# Patient Record
Sex: Female | Born: 1950 | Race: White | Hispanic: No | Marital: Married | Smoking: Never smoker
Health system: Southern US, Community
[De-identification: ages and names within clinical notes are randomized; demographics above are authoritative.]

## PROBLEM LIST (undated history)

## (undated) DIAGNOSIS — R112 Nausea with vomiting, unspecified: Secondary | ICD-10-CM

## (undated) DIAGNOSIS — E611 Iron deficiency: Secondary | ICD-10-CM

## (undated) DIAGNOSIS — N390 Urinary tract infection, site not specified: Secondary | ICD-10-CM

## (undated) DIAGNOSIS — K635 Polyp of colon: Secondary | ICD-10-CM

## (undated) DIAGNOSIS — R32 Unspecified urinary incontinence: Secondary | ICD-10-CM

## (undated) DIAGNOSIS — K589 Irritable bowel syndrome without diarrhea: Secondary | ICD-10-CM

## (undated) DIAGNOSIS — D649 Anemia, unspecified: Secondary | ICD-10-CM

## (undated) DIAGNOSIS — Z9889 Other specified postprocedural states: Secondary | ICD-10-CM

## (undated) DIAGNOSIS — M1990 Unspecified osteoarthritis, unspecified site: Secondary | ICD-10-CM

## (undated) DIAGNOSIS — R413 Other amnesia: Secondary | ICD-10-CM

## (undated) HISTORY — DX: Polyp of colon: K63.5

## (undated) HISTORY — DX: Unspecified urinary incontinence: R32

## (undated) HISTORY — DX: Other amnesia: R41.3

## (undated) HISTORY — PX: KNEE ARTHROSCOPY: SUR90

## (undated) HISTORY — PX: LAPAROSCOPIC CHOLECYSTECTOMY: SUR755

## (undated) HISTORY — PX: VAGINAL HYSTERECTOMY: SUR661

## (undated) HISTORY — PX: REPLACEMENT TOTAL KNEE: SUR1224

## (undated) HISTORY — DX: Iron deficiency: E61.1

## (undated) HISTORY — PX: BACK SURGERY: SHX140

## (undated) HISTORY — PX: BLADDER SURGERY: SHX569

## (undated) HISTORY — DX: Irritable bowel syndrome, unspecified: K58.9

---

## 1992-10-13 HISTORY — PX: TOE SURGERY: SHX1073

## 2000-09-11 ENCOUNTER — Encounter (INDEPENDENT_AMBULATORY_CARE_PROVIDER_SITE_OTHER): Payer: Self-pay | Admitting: *Deleted

## 2000-09-11 ENCOUNTER — Ambulatory Visit (HOSPITAL_BASED_OUTPATIENT_CLINIC_OR_DEPARTMENT_OTHER): Admission: RE | Admit: 2000-09-11 | Discharge: 2000-09-11 | Payer: Self-pay | Admitting: General Surgery

## 2003-12-01 ENCOUNTER — Ambulatory Visit (HOSPITAL_COMMUNITY): Admission: RE | Admit: 2003-12-01 | Discharge: 2003-12-01 | Payer: Self-pay | Admitting: Internal Medicine

## 2004-01-22 ENCOUNTER — Ambulatory Visit (HOSPITAL_BASED_OUTPATIENT_CLINIC_OR_DEPARTMENT_OTHER): Admission: RE | Admit: 2004-01-22 | Discharge: 2004-01-22 | Payer: Self-pay | Admitting: Urology

## 2004-12-29 ENCOUNTER — Emergency Department (HOSPITAL_COMMUNITY): Admission: EM | Admit: 2004-12-29 | Discharge: 2004-12-29 | Payer: Self-pay | Admitting: Emergency Medicine

## 2005-04-21 ENCOUNTER — Ambulatory Visit (HOSPITAL_COMMUNITY): Admission: RE | Admit: 2005-04-21 | Discharge: 2005-04-22 | Payer: Self-pay | Admitting: Neurosurgery

## 2005-12-04 ENCOUNTER — Ambulatory Visit: Payer: Self-pay | Admitting: Internal Medicine

## 2006-11-02 ENCOUNTER — Ambulatory Visit: Payer: Self-pay | Admitting: Internal Medicine

## 2006-12-25 ENCOUNTER — Ambulatory Visit: Payer: Self-pay | Admitting: Internal Medicine

## 2006-12-31 ENCOUNTER — Ambulatory Visit (HOSPITAL_COMMUNITY): Admission: RE | Admit: 2006-12-31 | Discharge: 2006-12-31 | Payer: Self-pay | Admitting: Internal Medicine

## 2010-09-30 ENCOUNTER — Ambulatory Visit: Payer: Self-pay | Admitting: Gynecology

## 2010-10-21 ENCOUNTER — Inpatient Hospital Stay (HOSPITAL_COMMUNITY)
Admission: RE | Admit: 2010-10-21 | Discharge: 2010-10-24 | Payer: Self-pay | Source: Home / Self Care | Attending: Orthopedic Surgery | Admitting: Orthopedic Surgery

## 2010-10-28 LAB — CBC
HCT: 32.5 % — ABNORMAL LOW (ref 36.0–46.0)
HCT: 30 % — ABNORMAL LOW (ref 36.0–46.0)
HCT: 29.4 % — ABNORMAL LOW (ref 36.0–46.0)
Hemoglobin: 10.5 g/dL — ABNORMAL LOW (ref 12.0–15.0)
Hemoglobin: 9.5 g/dL — ABNORMAL LOW (ref 12.0–15.0)
MCH: 30.1 pg (ref 26.0–34.0)
MCH: 30.1 pg (ref 26.0–34.0)
MCH: 30.2 pg (ref 26.0–34.0)
MCHC: 32.3 g/dL (ref 30.0–36.0)
MCHC: 32.3 g/dL (ref 30.0–36.0)
MCHC: 32.3 g/dL (ref 30.0–36.0)
MCV: 93.1 fL (ref 78.0–100.0)
MCV: 93.2 fL (ref 78.0–100.0)
MCV: 93.3 fL (ref 78.0–100.0)
Platelets: 245 10*3/uL (ref 150–400)
Platelets: 203 10*3/uL (ref 150–400)
Platelets: 207 10*3/uL (ref 150–400)
RBC: 3.49 MIL/uL — ABNORMAL LOW (ref 3.87–5.11)
RBC: 3.22 MIL/uL — ABNORMAL LOW (ref 3.87–5.11)
RBC: 3.15 MIL/uL — ABNORMAL LOW (ref 3.87–5.11)
RDW: 13.6 % (ref 11.5–15.5)
RDW: 13.5 % (ref 11.5–15.5)
RDW: 13.6 % (ref 11.5–15.5)
WBC: 8.6 10*3/uL (ref 4.0–10.5)
WBC: 8.5 10*3/uL (ref 4.0–10.5)
WBC: 8 10*3/uL (ref 4.0–10.5)

## 2010-10-28 LAB — BASIC METABOLIC PANEL
BUN: 10 mg/dL (ref 6–23)
BUN: 7 mg/dL (ref 6–23)
CO2: 28 mEq/L (ref 19–32)
CO2: 27 mEq/L (ref 19–32)
Calcium: 8.9 mg/dL (ref 8.4–10.5)
Calcium: 8.6 mg/dL (ref 8.4–10.5)
Chloride: 102 mEq/L (ref 96–112)
Chloride: 105 mEq/L (ref 96–112)
Creatinine, Ser: 0.71 mg/dL (ref 0.4–1.2)
Creatinine, Ser: 0.66 mg/dL (ref 0.4–1.2)
GFR calc Af Amer: >60 mL/min (ref >60)
GFR calc Af Amer: >60 mL/min (ref >60)
GFR calc non Af Amer: >60 mL/min (ref >60)
GFR calc non Af Amer: >60 mL/min (ref >60)
Glucose, Bld: 120 mg/dL — ABNORMAL HIGH (ref 70–99)
Glucose, Bld: 137 mg/dL — ABNORMAL HIGH (ref 70–99)
Potassium: 4.3 mEq/L (ref 3.5–5.1)
Potassium: 3.7 mEq/L (ref 3.5–5.1)
Sodium: 137 mEq/L (ref 135–145)
Sodium: 138 mEq/L (ref 135–145)

## 2010-10-28 LAB — PROTIME-INR
INR: 1.09 (ref 0.00–1.49)
INR: 1.61 — ABNORMAL HIGH (ref 0.00–1.49)
Prothrombin Time: 14.3 seconds (ref 11.6–15.2)
Prothrombin Time: 16.9 seconds — ABNORMAL HIGH (ref 11.6–15.2)
Prothrombin Time: 19.3 seconds — ABNORMAL HIGH (ref 11.6–15.2)

## 2010-10-28 LAB — TYPE AND SCREEN
ABO/RH(D): A POS
Antibody Screen: NEGATIVE

## 2010-10-28 LAB — ABO/RH: ABO/RH(D): A POS

## 2010-11-03 ENCOUNTER — Encounter: Payer: Self-pay | Admitting: *Deleted

## 2010-11-13 NOTE — Discharge Summary (Signed)
Tara Bates, Tara Bates               ACCOUNT NO.:  [id number]  MEDICAL RECORD NO.:  [id number]          PATIENT TYPE:  INP  LOCATION:  1608                         FACILITY:  [facility]  PHYSICIAN:  Ollen Gross, M.D.    DATE OF BIRTH:  1951-05-15  DATE OF ADMISSION:  10/21/2010 DATE OF DISCHARGE:  10/24/2010                              DISCHARGE SUMMARY   ADMITTING DIAGNOSES: 1. Osteoarthritis, left knee. 2. Irritable bowel syndrome. 3. Lactose intolerance. 4. Mild urinary incontinence which is intermittent. 5. History of urinary tract infections. 6. Degenerative disk disease.  DISCHARGE DIAGNOSES: 1. Osteoarthritis, left knee, status post left total knee replacement     arthroplasty. 2. Postop acute blood loss anemia. 3. Irritable bowel syndrome. 4. Lactose intolerance. 5. Mild urinary incontinence which is intermittent. 6. History of urinary tract infections. 7. Degenerative disk disease.  PROCEDURE:  October 21, 2010, left total knee.  Surgeon, Dr. Lequita Halt. Assistant, Alexzandrew L. Perkins, P.A.C.  Anesthesia, general. Tourniquet time, 35 minutes.  CONSULTS:  None.  BRIEF HISTORY:  Tara Bates is a 60 year old female with end-stage arthritis of left knee, progressive worsening pain and dysfunction, failed nonoperative management, now presents for a total knee arthroplasty.  LABORATORY DATA:  Preop CBC showed hemoglobin of 12.6, hematocrit of 39.4 white cell count 6.1, platelets 273.  PT/INR of 13.4 and 1 with PTT of 29.  Chem panel on admission all within normal limits.  Preop UA negative.  Blood group type A positive.  Nasal swabs were negative staph aureus and MRSA.  Serial CBCs were followed.  Hemoglobin dropped down to 10.5, then 9.7; last H and H was 9.5 and 29.4.  Serial protime followed per Coumadin protocol.  Last PT/INR of 19.3 and 1.61.  Serial BMETs were followed for 48 hours.  Electrolytes remained within normal limits.  HOSPITAL COURSE:  The patient  admitted to [facility], taken to OR, underwent above-stated procedure without complications.  The patient tolerated the procedure well, later transferred to recovery room on orthopedic floor.  She was started on p.o. and IV analgesic pain control following surgery, given 24 hours postop IV antibiotics.  She had some intermittent nausea throughout the morning which was improved with meds. Did not get much sleep but started to get it with therapy.  Hemoglobin was 10.5 on day #1.  Hemovac drain was pulled.  By day #2, her nausea had improved.  Her hemoglobin was down a little bit to 9.7 which was asymptomatic with this.  We changed the dressing.  Her incision looked good.  She was walking over 80 feet.  She continued to progress well and by postop day #3, the nausea had all but gone.  She just had a little bit intermittent.  She felt good otherwise and she wanted to go home after therapy.  DISCHARGE/PLAN: 1. The patient home on October 24, 2010. 2. Discharge diagnoses, please see above. 3. Discharge meds:  Coumadin, Nu-Iron, Robaxin, Percocet, Phenergan.  DIET:  As tolerated.  FOLLOWUP:  Two weeks.  ACTIVITY:  Weightbearing as tolerated.  Total knee protocol; Home Health PT, Home Health Nursing.  DISPOSITION:  Home.  CONDITION ON DISCHARGE:  Improved.     Alexzandrew L. Julien Girt, P.A.C.   ______________________________ Ollen Gross, M.D.    ALP/MEDQ  D:  10/24/2010  T:  10/24/2010  Job:  [id number]  cc:   Fara Chute Fax: [phone]  Electronically Signed by Patrica Duel P.A.C. on 10/31/2010 11:09:43 AM Electronically Signed by Ollen Gross M.D. on 11/13/2010 03:33:53 PM

## 2010-12-23 LAB — CBC
HCT: 39.4 % (ref 36.0–46.0)
Hemoglobin: 12.6 g/dL (ref 12.0–15.0)
MCH: 30.1 pg (ref 26.0–34.0)
MCHC: 32 g/dL (ref 30.0–36.0)
MCV: 94.3 fL (ref 78.0–100.0)
Platelets: 273 10*3/uL (ref 150–400)
RBC: 4.18 MIL/uL (ref 3.87–5.11)
RDW: 13.9 % (ref 11.5–15.5)
WBC: 6.1 10*3/uL (ref 4.0–10.5)

## 2010-12-23 LAB — URINALYSIS, ROUTINE W REFLEX MICROSCOPIC
Bilirubin Urine: NEGATIVE
Glucose, UA: NEGATIVE mg/dL
Hgb urine dipstick: NEGATIVE
Ketones, ur: NEGATIVE mg/dL
Nitrite: NEGATIVE
Protein, ur: NEGATIVE mg/dL
Specific Gravity, Urine: 1.006 (ref 1.005–1.030)
Urobilinogen, UA: 0.2 mg/dL (ref 0.0–1.0)
pH: 6.5 (ref 5.0–8.0)

## 2010-12-23 LAB — COMPREHENSIVE METABOLIC PANEL
ALT: 22 U/L (ref 0–35)
AST: 23 U/L (ref 0–37)
Albumin: 4.1 g/dL (ref 3.5–5.2)
Alkaline Phosphatase: 45 U/L (ref 39–117)
BUN: 11 mg/dL (ref 6–23)
CO2: 31 mEq/L (ref 19–32)
Calcium: 9.5 mg/dL (ref 8.4–10.5)
Chloride: 106 mEq/L (ref 96–112)
Creatinine, Ser: 0.81 mg/dL (ref 0.4–1.2)
GFR calc Af Amer: >60 mL/min (ref >60)
GFR calc non Af Amer: >60 mL/min (ref >60)
Glucose, Bld: 86 mg/dL (ref 70–99)
Potassium: 4.1 mEq/L (ref 3.5–5.1)
Sodium: 144 mEq/L (ref 135–145)
Total Bilirubin: 0.8 mg/dL (ref 0.3–1.2)
Total Protein: 7.3 g/dL (ref 6.0–8.3)

## 2010-12-23 LAB — PROTIME-INR
INR: 1 (ref 0.00–1.49)
Prothrombin Time: 13.4 seconds (ref 11.6–15.2)

## 2010-12-23 LAB — APTT: aPTT: 29 seconds (ref 24–37)

## 2010-12-23 LAB — SURGICAL PCR SCREEN
MRSA, PCR: NEGATIVE
Staphylococcus aureus: NEGATIVE

## 2010-12-26 ENCOUNTER — Other Ambulatory Visit (HOSPITAL_COMMUNITY)
Admission: RE | Admit: 2010-12-26 | Discharge: 2010-12-26 | Disposition: A | Discharge status: Home / Self Care | Payer: Federal, State, Local not specified - PPO | Source: Ambulatory Visit | Attending: Gynecology | Admitting: Gynecology

## 2010-12-26 ENCOUNTER — Ambulatory Visit (INDEPENDENT_AMBULATORY_CARE_PROVIDER_SITE_OTHER): Payer: Federal, State, Local not specified - PPO | Admitting: Gynecology

## 2010-12-26 ENCOUNTER — Other Ambulatory Visit: Payer: Self-pay | Admitting: Gynecology

## 2010-12-26 DIAGNOSIS — Z01419 Encounter for gynecological examination (general) (routine) without abnormal findings: Secondary | ICD-10-CM

## 2010-12-26 DIAGNOSIS — Z1211 Encounter for screening for malignant neoplasm of colon: Secondary | ICD-10-CM

## 2010-12-26 DIAGNOSIS — Z833 Family history of diabetes mellitus: Secondary | ICD-10-CM

## 2010-12-26 DIAGNOSIS — N951 Menopausal and female climacteric states: Secondary | ICD-10-CM

## 2010-12-26 DIAGNOSIS — Z13220 Encounter for screening for lipoid disorders: Secondary | ICD-10-CM

## 2010-12-26 DIAGNOSIS — Z124 Encounter for screening for malignant neoplasm of cervix: Secondary | ICD-10-CM | POA: Insufficient documentation

## 2010-12-31 ENCOUNTER — Encounter (INDEPENDENT_AMBULATORY_CARE_PROVIDER_SITE_OTHER): Payer: Federal, State, Local not specified - PPO

## 2010-12-31 DIAGNOSIS — Z13820 Encounter for screening for osteoporosis: Secondary | ICD-10-CM

## 2011-01-02 ENCOUNTER — Institutional Professional Consult (permissible substitution) (INDEPENDENT_AMBULATORY_CARE_PROVIDER_SITE_OTHER): Payer: Federal, State, Local not specified - PPO | Admitting: Gynecology

## 2011-01-02 DIAGNOSIS — N951 Menopausal and female climacteric states: Secondary | ICD-10-CM

## 2011-01-02 DIAGNOSIS — E559 Vitamin D deficiency, unspecified: Secondary | ICD-10-CM

## 2011-02-28 NOTE — Op Note (Signed)
NAMEMIRIAH, MARUYAMA               ACCOUNT NO.:  [id number]   MEDICAL RECORD NO.:  [id number]          PATIENT TYPE:  OIB   LOCATION:  3040                         FACILITY:  [facility]   PHYSICIAN:  Cristi Loron, M.D.DATE OF BIRTH:  04-Jun-1951   DATE OF PROCEDURE:  04/21/2005  DATE OF DISCHARGE:                                 OPERATIVE REPORT   BRIEF HISTORY:  The patient is a 60 year old white female who suffered from  back and right leg pain.  She failed medical management and was worked up  with a lumbar MRI which demonstrated a herniated disc at L3-4 and L4-5 on  the right.  I discussed the various treatment options with her including  surgery.  The patient has weighed the risks, benefits, and alternatives of  surgery and has decided to proceed with a right L3-4 and L4-5  microdiskectomy.   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   PROCEDURE:  Right L3-4 and L4-5 microdiskectomy using microdissection.   SURGEON:  Cristi Loron, M.D.   ASSISTANT:  Stefani Dama, M.D.   ANESTHESIA:  General endotracheal.   ESTIMATED BLOOD LOSS:  50 cc.   SPECIMENS:  None.   DRAINS:  None.   COMPLICATIONS:  None.   DESCRIPTION OF PROCEDURE:  The patient was brought to the operating room by  the anesthesia team.  General endotracheal anesthesia was induced.  The  patient was then carefully turned to the prone position on the Wilson frame.  Her lumbosacral region was then prepared with Betadine scrub and Betadine  solution.  Sterile drapes were applied.  I then injected the area to be  incised with Marcaine with epinephrine solution.  I then used a scalpel to  make a linear midline incision over the L3-4 and L4-5 interspace.  I used  electrocautery to perform a  subperiosteal dissection, exposing the right  spinous process and lamina of L3, L4, and L5.  I then obtained  intraoperative radiograph to confirm our location.  I then brought the  operative microscope into the field, and under its magnification and  illumination I completed the microdissection/decompression.  I used a high-  speed drill to perform a right L3 and L4 laminotomy.  I widened the  laminotomy with the Kerrison punch and then performed a foraminotomy about  the right L4 and L5 nerve roots; particularly the L5 nerve root neural  foramen was quite stenotic.  We then used microdissection to free up the  thecal sac and the nerve roots from the epidural tissue.  Dr. Danielle Dess  carefully retracted the right thecal sac and L5 nerve root medially which  exposed an underlying free fragment disc herniation which had migrated in a  caudal direction and was severely compressing the L5 nerve root as it exited  the neural foramen.  We freed it up with a nerve hook and then removed it in  multiple fragments using the pituitary  forceps which greatly decompressed  the right L5 nerve root.  We then inspected the L4-5 intervertebral disc and  noted that it was bulging somewhat but was not causing any significant  neural compression of the thecal sac or the L5 nerve root.  We therefore did  not enter into the intervertebral disc space.   We then repeated this procedure at L3-4.  We widened the L3 laminotomy.  We  performed a foraminotomy about the L4 nerve root.  Dr. Danielle Dess then carefully  retracted the thecal sac and the right L4 nerve root medially with the  D'erico retractor.  This exposed the underlying intervertebral disc.  There  was a moderate-size disc herniation which had migrated caudally.  We freed  it up using microdissection.  We then removed the disc herniation in  multiple fragments with the pituitary forceps, decompressing the right L4  nerve root.  We also inspected the right L3-4  intervertebral disc.  There  was some bulging again, but no significant neural compression.  We therefore  did not enter into the intervertebral disc space.  We at this point had a  good decompression of the right L4 and L5 nerve root.  We then obtained  stringent hemostasis using bipolar electrocautery.  We copiously irrigated  the wound out with bacitracin solution.  We removed the solution, then  removed the [facility] retractor.  We then reapproximated the patient's  thoracolumbar fascia with interrupted #1 Vicryl suture, subcutaneous tissue  with interrupted 2-0 Vicryl suture, and the skin with Steri-Strips and  Benzoin.  The wound was then coated with bacitracin ointment.  A sterile  dressing was applied.  The drapes were removed.  The patient was  subsequently returned to the supine position, where she was extubated by the  anesthesia team and transported to the postanesthesia care unit in stable  condition.  All sponge, instrument, and needle counts were correct at the  end of this case.      Cristi Loron, M.D.  Electronically Signed     JDJ/MEDQ  D:  04/21/2005  T:  04/22/2005  Job:  [id number]

## 2011-02-28 NOTE — Op Note (Signed)
NAME:  Tara Bates, Tara Bates                         ACCOUNT NO.:  [id number]   MEDICAL RECORD NO.:  [id number]                   PATIENT TYPE:  AMB   LOCATION:  NESC                                 FACILITY:  [facility]   PHYSICIAN:  Sigmund I. Patsi Sears, M.D.         DATE OF BIRTH:  06/20/1951   DATE OF PROCEDURE:  01/22/2004  DATE OF DISCHARGE:                                 OPERATIVE REPORT   PREOPERATIVE DIAGNOSES:  1. Recurrent pelvic prolapse.  2. Urinary incontinence.   POSTOPERATIVE DIAGNOSES:  1. Recurrent pelvic prolapse.  2. Urinary incontinence.   OPERATION:  Mentor transvaginal pubovaginal sling and anterior vaginal vault  repair with Vicryl mesh.   SURGEON:  Dr. Patsi Sears.   ANESTHESIA:  General LMA.   PREPARATION:  After appropriate preanesthesia, the patient is brought to the  operating room, placed on the operating table in dorsal supine position  where general LMA anesthesia was introduced.  She was then re-placed in the  dorsal lithotomy position where pubis was prepped with Betadine solution and  draped in the usual fashion.   REVIEW OF HISTORY:  This 60 year old female is status post vaginal  hysterectomy with anterior vaginal vault repair and pubovaginal sling in  March 2001.  Currently, the patient complains of severe cough, laugh, sneeze  incontinence, as well as urge incontinence.  She was dry after her original  surgery but began to leak 1 year postoperatively.  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  She has had physical examination which shows  anterior vaginal vault weakness, cystocele, positive Marshall test, positive  Q-tip test.  Urodynamics is accomplished, and the results are recorded in  her office chart.  She is now for pubovaginal sling and anterior vaginal  vault repair.   DESCRIPTION OF PROCEDURE:  With the patient in the dorsal lithotomy  position, after  prepping the pubis, Foley catheter was placed.  A 2 cm  anterior urethral incision is made, subcutaneous tissue dissected after 10  mL of Marcaine 0.25 plain were injected into the anterior paracervical  periurethral tissue.  Following dissection, tissue dissection was accomplished bilaterally, which  was very difficult because of scarring.  Dissection was accomplished  bilaterally, to the level of the retropubic fascia.  Five centimeters  lateral to the clitoris, an incision is made and using the Mentor  transobturator sling approach, a pubovaginal sling was placed.  With the  midline of the sling protected, cystoscopy was accomplished with both the 70-  degree lens and the 12-degree lens.  There was no evidence of any injury to  the bladder; therefore; a cystoscopy was finished and the bladder drained of  fluid again.  The wound was then closed in two layers with 3-0 Vicryl  suture.   The anterior vaginal vault repair was then accomplished by injecting the  anterior vagina  with Marcaine 0.25% plain and making a 10 cm incision, and  subcutaneous tissue was dissected bilaterally with sharp and blunt  dissection.  The anterior vaginal vault was accomplished using horizontal  mattress Kelly plication sutures, reinforced with a piece of Vicryl mesh  which was sutured into place in six places, at the 1 o'clock position, the 3  o'clock position, the 5 o'clock position, and the 11 o'clock, the 9 o'clock,  and the 7 o'clock positions.  All bleeding was electrocoagulated, and the  scarred vaginal tissue was excised.  Fresh edges were brought together in  two layers with 3-0 Vicryl suture.  Foley catheter was taken out, and no  bleeding was noted, and no packing was used.  The patient was then awakened  and taken to the recovery room in good condition.                                               Sigmund I. Patsi Sears, M.D.    SIT/MEDQ  D:  01/22/2004  T:  01/22/2004  Job:  [id number]

## 2011-02-28 NOTE — Op Note (Signed)
NAME:  Tara Bates, Tara Bates                         ACCOUNT NO.:  [id number]   MEDICAL RECORD NO.:  [id number]                   PATIENT TYPE:  AMB   LOCATION:  DAY                                  FACILITY:  [facility]   PHYSICIAN:  Lionel December, M.D.                 DATE OF BIRTH:  28-Aug-1951   DATE OF PROCEDURE:  12/01/2003  DATE OF DISCHARGE:                                 OPERATIVE REPORT   PROCEDURE:  Total colonoscopy.   INDICATIONS FOR PROCEDURE:  [name] is a 60 year old Caucasian female with a  history of colonic polyps.  Her last exam was in September of 1999 when she  had a tubular adenoma removed. She is undergoing surveillance examination.  The procedure is reviewed with the patient and informed consent was  obtained.   PREOP MEDICATIONS:  Demerol 50 mg IV, Versed 6 mg IV in divided dose.   FINDINGS:  Procedure performed in endoscopy suite.  The patient's vital  signs and O2 saturations were monitored during the procedure and remained  stable. The patient was placed in the left lateral decubitus position,  rectal examination performed.  No abnormality noted on external or digital  exam. The Olympus videoscope was placed in the rectum and advanced under  direct vision into the sigmoid colon where a few scattered diverticula were  noted. Preparation was felt to be excellent. The scope was passed in the  cecum which was identified by appendiceal orifice and ileocecal valve.  Pictures taken for the record. As the scope was withdrawn, the colonic  mucosa was carefully examined. There was a single small polyp at the rectum  which was ablated via cold biopsy.  Anal rectal junction was examined by  retroflexing the scope and was normal.  The endoscope was straightened and  withdrawn. The patient tolerated the procedure well.   FINAL DIAGNOSIS:  1. A few scattered diverticula at the sigmoid colon.  2. Small polyp of the rectum that was ablated via cold biopsy.   RECOMMENDATIONS:  1.  She will continue her high fiber diet and Citrucel as before.  2. Dicyclomine 10-20 mg p.o. t.i.d. p.r.n. for abdominal cramps or relapse     of her irritable bowel syndrome.  A prescription given for 60 with two     refills.  3. I will be contacting the patient with biopsy results.      ___________________________________________                                            Lionel December, M.D.   NR/MEDQ  D:  12/01/2003  T:  12/01/2003  Job:  [id number]   cc:   Fara Chute  [address] [place]  [place] [address]  Fax: [phone]

## 2011-09-11 ENCOUNTER — Encounter: Payer: Self-pay | Admitting: Gynecology

## 2011-09-11 ENCOUNTER — Other Ambulatory Visit: Payer: Self-pay | Admitting: *Deleted

## 2011-09-11 DIAGNOSIS — R922 Inconclusive mammogram: Secondary | ICD-10-CM

## 2011-09-18 ENCOUNTER — Other Ambulatory Visit: Payer: Self-pay | Admitting: Gynecology

## 2011-09-18 DIAGNOSIS — R922 Inconclusive mammogram: Secondary | ICD-10-CM

## 2011-11-20 ENCOUNTER — Encounter: Payer: Self-pay | Admitting: Gynecology

## 2011-11-20 ENCOUNTER — Ambulatory Visit (INDEPENDENT_AMBULATORY_CARE_PROVIDER_SITE_OTHER): Payer: Federal, State, Local not specified - PPO | Admitting: Gynecology

## 2011-11-20 VITALS — BP 110/70

## 2011-11-20 DIAGNOSIS — Z1211 Encounter for screening for malignant neoplasm of colon: Secondary | ICD-10-CM

## 2011-11-20 DIAGNOSIS — R102 Pelvic and perineal pain: Secondary | ICD-10-CM

## 2011-11-20 DIAGNOSIS — N949 Unspecified condition associated with female genital organs and menstrual cycle: Secondary | ICD-10-CM

## 2011-11-20 LAB — URINALYSIS W MICROSCOPIC + REFLEX CULTURE
Bilirubin Urine: NEGATIVE
Glucose, UA: NEGATIVE mg/dL
Hgb urine dipstick: NEGATIVE
Ketones, ur: NEGATIVE mg/dL
Leukocytes, UA: NEGATIVE
Nitrite: NEGATIVE
Protein, ur: NEGATIVE mg/dL
Specific Gravity, Urine: <1.005 — ABNORMAL LOW (ref 1.005–1.030)
Urobilinogen, UA: 0.2 mg/dL (ref 0.0–1.0)
pH: 6.5 (ref 5.0–8.0)

## 2011-11-20 NOTE — Patient Instructions (Addendum)
Will see you next week for ultrasound.  Cystocele Repair A cystocele is a bulging, drooping hernia or break (rupture) of bladder tissue into the birth canal (vagina). This bulging or rupture occurs on the top front wall of the vagina. CAUSES  Cystocele is associated with weakness of the top front wall of the vagina due to stretching and tearing of the ligaments and muscles in the area. This is often the result of:  Multiple childbirths.   Continuous heavy lifting.   Chronic cough from asthma, emphysema, or smoking.   Being overweight.   Changes from aging.   Previous surgery in the vaginal area.   Menopause with loss of estrogen hormone and weakening of the ligaments and muscles around the bladder.  SYMPTOMS   Uncontrolled loss of urine (incontinence) with cough, sneeze, or exercise.   Pelvic pressure.   Frequency or urgency to urinate because of inability to completely empty the bladder.   Bladder infections.   Needing to push on the upper vagina to help yourself pass urine.  DIAGNOSIS  A cystocele can be diagnosed by doing a pelvic exam and observing the top of the vagina drooping or bulging into or out of the vagina. TREATMENT  Surgical options:  Cystocele repair is surgery that removes the hernia.   There are also different "sling" operations that may be used.  Discuss the different types of surgeries to repair a cystocele with your caregiver. Your caregiver will decide what type of surgery will be best in your case. Nonsurgical options:  Kegel exercises. This helps strengthen and tighten the muscles and tissue in and around the bladder and vagina. This may help with mild cases of cystocele.   A pessary may help the cystocele. A pessary is a plastic or rubber device that lifts the bladder into place. A pessary must be fitted by a doctor.   Tampons or diaphragms that lift the bladder into place are sometimes helpful with a minor or small cystocele.   Estrogen may  help with mild cases in menopausal and aging women.  LET YOUR CAREGIVER KNOW ABOUT:   Allergies to food or medicine.   Medicines taken, including vitamins, herbs, eyedrops, over-the-counter medicines, and creams.   Use of steroids (by mouth or creams).   Previous problems with anesthetics or numbing medicines.   History of bleeding problems or blood clots.   Previous surgery.   Other health problems, including diabetes and kidney problems.   Possibility of pregnancy, if this applies.  RISKS AND COMPLICATIONS  All surgery is associated with risks.  There are risks with a general anesthesia. You should discuss this with your caregiver.   With spinal or epidural anesthesia, there may be an area that is not numbed, and you could feel pain.   Headache could occur with a spinal or epidural anesthetic.   The catheter you will have after surgery may not work properly or may get blocked and need to be replaced.   Excessive bleeding.   Infection.   Injury to surrounding structures.   Recurrence of the cystocele.   Surgery may not get rid of your symptoms.  BEFORE THE PROCEDURE   Do not take aspirin or blood thinners for 1 week prior to surgery, unless instructed otherwise.   Do not eat or drink anything after midnight the night before surgery.   Let your caregiver know if you develop a cold or other infectious problems prior to surgery.   If being admitted the day of surgery, you  should be present 1 hour prior to your procedure or as directed by your caregiver.   Plan and arrange for help when you go home from the hospital.   If you smoke, do not smoke for at least 2 weeks before the surgery.   Do not drink any alcohol for 3 days before the surgery.  PROCEDURE  You will be given an anesthetic to prevent you from feeling pain during surgery. This may be a general anesthetic that puts you to sleep, or a spinal or epidural anesthetic. You will be asleep or be numbed through  the entire procedure. During cystocele repair, tissue is pulled from the sides and around the top of the vagina to lift up the hernia. This removes the hernia so that the top of the vagina does not fall into the opening of the vagina. AFTER THE PROCEDURE  After surgery, you will be taken to the recovery room where a nurse will take care of you, checking your breathing, blood pressure, pulse, and your progress. When your caregiver feels you are stable, you will be taken to your room. You will have a drainage tube (Foley catheter) that will drain your bladder for 2 to 7 days or longer, until your bladder is working properly. This catheter is placed prior to surgery to help keep your bladder empty and out of the way during the procedure. After surgery, this will make passing your urine easier. The catheter will be removed when you can easily pass urine without this assistance. You may have gauze packing in the vagina that will be removed 1 to 2 days after the surgery. Usually, you will be given a medicine (antibiotic) that kills germs. You will be given pain medicine as needed. You can usually go home in 3 to 5 days. HOME CARE INSTRUCTIONS   Do not take baths. Take showers until your caregiver informs you otherwise.   Take antibiotics as directed by your caregiver.   Exercise as instructed. Do not perform exercises which increase the pressure inside your belly (abdomen), such as sit-ups or lifting weights, until your caregiver has given permission. Walking exercise is preferred.   Only take over-the-counter or prescription medicines for pain and discomfort as directed by your caregiver.   Do not drink alcohol while taking pain medicine.   Do not lift anything over 5 pounds.   Do not drive until your caregiver gives you permission.   Get plenty of rest and sleep.   Have someone help with your household chores for 1 to 2 weeks.   If you develop constipation, you may take a mild laxative with your  caregiver's permission. Eating bran foods and drinking enough water and fluids to keep your urine clear or pale yellow helps with constipation.   Do not take aspirin. It may cause bleeding.   You may resume normal diet and unstrenuous activities as directed.   Do not douche, use tampons, or engage in intercourse until your surgeon has given permission.   Change bandages (dressings) as directed.   Make and keep all your postoperative appointments.  SEEK MEDICAL CARE IF:   You have abnormal vaginal discharge.   You develop a rash.   You are having a reaction to your medicine.   You develop nausea or vomiting.  SEEK IMMEDIATE MEDICAL CARE IF:   You have redness, swelling, or increasing pain in the vaginal area.   You notice pus coming from the vagina.   You have a fever.   You  notice a bad smell coming from the vagina.   You have increasing abdominal pain.   You have frequent urination or you notice burning during urination.   You notice blood in your urine.   You have excessive vaginal bleeding.   You cannot urinate.  MAKE SURE YOU:   Understand these instructions.   Will watch your condition.   Will get help right away if you are not doing well or get worse.  Document Released: 09/26/2000 Document Revised: 06/11/2011 Document Reviewed: 12/27/2009 [facility] Patient Information 2012 [place], [place].

## 2011-11-20 NOTE — Progress Notes (Signed)
Patient is a 61 year old who presented to the office today complaining of fullness in her lower abdomen feeling bloated tiredness and recently had right upper quadrant right shoulder discomfort. Patient denies any hematochezia. No change in bowel or urinary function. Patient with prior history of TVH for symptomatic fibroids and subsequently has had some form of stress incontinence surgery in [place], [place]. [place]. She subsequently continued to suffer from stress incontinence after that surgery and was seen by Dr. Patsi Sears who did a second incontinence operation several years ago. She is currently on Vivelle-Dot 0.05 which she applies twice a week. Patient has minimal stress urinary incontinence.  Exam: Abdomen: Soft nontender no rebound or guarding Pelvic/Bartholin urethra Skene glands within normal limits Vagina: Second-degree cystocele vaginal cuff intact no rectocele Adnexa: No palpable masses slight tenderness midportion of pelvis. Rectal: Not examined  Assessment/plan: We'll check a CBC, comprehensive metabolic panel today. Patient will return back to the office next week for an ultrasound to assess her adnexa. We'll need to speak with Dr. Patsi Sears to see what and tied incontinence operation he had done and whether he would be interested repairing her cystocele. Rectal: Not examined

## 2011-11-21 DIAGNOSIS — K635 Polyp of colon: Secondary | ICD-10-CM | POA: Insufficient documentation

## 2011-11-21 LAB — CBC WITH DIFFERENTIAL/PLATELET
Basophils Absolute: 0 10*3/uL (ref 0.0–0.1)
Basophils Relative: 0 % (ref 0–1)
Eosinophils Absolute: 0.1 10*3/uL (ref 0.0–0.7)
Eosinophils Relative: 3 % (ref 0–5)
HCT: 40.7 % (ref 36.0–46.0)
Hemoglobin: 12.9 g/dL (ref 12.0–15.0)
Lymphocytes Relative: 31 % (ref 12–46)
Lymphs Abs: 1.3 10*3/uL (ref 0.7–4.0)
MCH: 29.1 pg (ref 26.0–34.0)
MCHC: 31.7 g/dL (ref 30.0–36.0)
MCV: 91.7 fL (ref 78.0–100.0)
Monocytes Absolute: 0.4 10*3/uL (ref 0.1–1.0)
Monocytes Relative: 9 % (ref 3–12)
Neutro Abs: 2.4 10*3/uL (ref 1.7–7.7)
Neutrophils Relative %: 57 % (ref 43–77)
Platelets: 280 10*3/uL (ref 150–400)
RBC: 4.44 MIL/uL (ref 3.87–5.11)
RDW: 14.8 % (ref 11.5–15.5)
WBC: 4.2 10*3/uL (ref 4.0–10.5)

## 2011-11-21 LAB — COMPREHENSIVE METABOLIC PANEL
ALT: 17 U/L (ref 0–35)
AST: 22 U/L (ref 0–37)
Albumin: 4.5 g/dL (ref 3.5–5.2)
Alkaline Phosphatase: 48 U/L (ref 39–117)
BUN: 13 mg/dL (ref 6–23)
CO2: 32 mEq/L (ref 19–32)
Calcium: 9.9 mg/dL (ref 8.4–10.5)
Chloride: 102 mEq/L (ref 96–112)
Creat: 0.89 mg/dL (ref 0.50–1.10)
Glucose, Bld: 99 mg/dL (ref 70–99)
Potassium: 4.2 mEq/L (ref 3.5–5.3)
Sodium: 140 mEq/L (ref 135–145)
Total Bilirubin: 0.8 mg/dL (ref 0.3–1.2)
Total Protein: 7 g/dL (ref 6.0–8.3)

## 2011-11-21 NOTE — Progress Notes (Signed)
Addended by: Bertram Savin A on: 11/21/2011 10:56 AM   Modules accepted: Orders

## 2011-11-24 ENCOUNTER — Ambulatory Visit (INDEPENDENT_AMBULATORY_CARE_PROVIDER_SITE_OTHER): Payer: Federal, State, Local not specified - PPO | Admitting: Gynecology

## 2011-11-24 ENCOUNTER — Ambulatory Visit (INDEPENDENT_AMBULATORY_CARE_PROVIDER_SITE_OTHER): Payer: Federal, State, Local not specified - PPO

## 2011-11-24 ENCOUNTER — Encounter: Payer: Self-pay | Admitting: Gynecology

## 2011-11-24 VITALS — BP 120/72

## 2011-11-24 DIAGNOSIS — N8111 Cystocele, midline: Secondary | ICD-10-CM

## 2011-11-24 DIAGNOSIS — N83339 Acquired atrophy of ovary and fallopian tube, unspecified side: Secondary | ICD-10-CM

## 2011-11-24 DIAGNOSIS — IMO0002 Reserved for concepts with insufficient information to code with codable children: Secondary | ICD-10-CM | POA: Insufficient documentation

## 2011-11-24 DIAGNOSIS — N949 Unspecified condition associated with female genital organs and menstrual cycle: Secondary | ICD-10-CM

## 2011-11-24 DIAGNOSIS — R102 Pelvic and perineal pain: Secondary | ICD-10-CM

## 2011-11-24 NOTE — Progress Notes (Signed)
Patient is a 61 year old who presented to the office today to discuss her results of the ultrasound and recent lab work done in the office. Please see previous encounter note from February 7 whereby patient was complaining of lower abdominal pressure and bloating sensation and right upper quadrant discomfort.  Conference metabolic panel urinalysis and CBC were all normal.  Ultrasound: Absent uterus normal right and left ovary  Patient with symptomatic second degree cystocele contributing to her pressure but no stress incontinence reported. Patient with 2 prior anti-incontinent procedures 1 done in in [place], [place]. [place] and the other 1 done by Dr. Patsi Sears hearing [facility]. Patient is recommended she followup with Dr. Patsi Sears for cystocele repair/revision if patient's symptoms worsen. Patient was offered pessary but states she wants to wait and see after she sees Dr. Patsi Sears. Patient denies any vaginal atrophy or  dryness or irritation.

## 2011-11-24 NOTE — Patient Instructions (Signed)
Please make appointment with Dr. Patsi Sears urologist

## 2011-11-25 LAB — POC HEMOCCULT BLD/STL (OFFICE/1-CARD/DIAGNOSTIC): Fecal Occult Blood, POC: NEGATIVE

## 2011-11-28 ENCOUNTER — Ambulatory Visit: Payer: Federal, State, Local not specified - PPO | Admitting: Gynecology

## 2011-11-28 ENCOUNTER — Other Ambulatory Visit: Payer: Federal, State, Local not specified - PPO

## 2011-12-26 ENCOUNTER — Other Ambulatory Visit: Payer: Self-pay

## 2011-12-26 MED ORDER — ESTRADIOL 0.05 MG/24HR TD PTTW: 1.0000 | MEDICATED_PATCH | TRANSDERMAL | Status: DC

## 2011-12-26 NOTE — Telephone Encounter (Signed)
Patient has CE scheduled for the end of this month.

## 2012-01-02 ENCOUNTER — Encounter: Payer: Federal, State, Local not specified - PPO | Admitting: Gynecology

## 2012-01-08 ENCOUNTER — Ambulatory Visit (INDEPENDENT_AMBULATORY_CARE_PROVIDER_SITE_OTHER): Payer: Federal, State, Local not specified - PPO | Admitting: Gynecology

## 2012-01-08 ENCOUNTER — Encounter: Payer: Self-pay | Admitting: Gynecology

## 2012-01-08 VITALS — BP 110/78 | Ht 66.25 in | Wt 172.0 lb

## 2012-01-08 DIAGNOSIS — Z780 Asymptomatic menopausal state: Secondary | ICD-10-CM

## 2012-01-08 DIAGNOSIS — N951 Menopausal and female climacteric states: Secondary | ICD-10-CM

## 2012-01-08 DIAGNOSIS — Z01419 Encounter for gynecological examination (general) (routine) without abnormal findings: Secondary | ICD-10-CM

## 2012-01-08 MED ORDER — ESTRADIOL 0.0375 MG/24HR TD PTTW: 1.0000 | MEDICATED_PATCH | TRANSDERMAL | Status: DC

## 2012-01-08 NOTE — Progress Notes (Signed)
Tara Bates 1951/09/13 [id number]   History:    61 y.o.  gravida 2 para 2 who presented to the office for her annual gynecological examination. Patient is being followed by Dr. Neita Carp in [place], [place] for which she scheduled to get her lab work in the next few weeks. Review of her record indicated her mammogram was normal in 2012. Patient with prior history of a transvaginal hysterectomy and sling procedure. She has been on hormone replacement therapy now for 6 years. Patient with benign colonic polyps in 2009. Last bone density study March 2012 which was normal.  Past medical history,surgical history, family history and social history were all reviewed and documented in the EPIC chart.  Gynecologic History No LMP recorded. Patient has had a hysterectomy. Contraception: none Last Pap:  2012. Results were: normal Last mammogram:  2012. Results were: normal  Obstetric History OB History    Grav Para Term Preterm Abortions TAB SAB Ect Mult Living   2 2 2       2      # Outc Date GA Lbr Len/2nd Wgt Sex Del Anes PTL Lv   1 TRM     F SVD  No Yes   2 TRM     M SVD  No Yes       ROS:  Was performed and pertinent positives and negatives are included in the history.  Exam: chaperone present  BP 110/78  Ht 5' 6.25" (1.683 m)  Wt 172 lb (78.019 kg)  BMI 27.55 kg/m2  Body mass index is 27.55 kg/(m^2).  General appearance : Well developed well nourished female. No acute distress HEENT: Neck supple, trachea midline, no carotid bruits, no thyroidmegaly Lungs: Clear to auscultation, no rhonchi or wheezes, or rib retractions  Heart: Regular rate and rhythm, no murmurs or gallops Breast:Examined in sitting and supine position were symmetrical in appearance, no palpable masses or tenderness,  no skin retraction, no nipple inversion, no nipple discharge, no skin discoloration, no axillary or supraclavicular lymphadenopathy Abdomen: no palpable masses or tenderness, no rebound or  guarding Extremities: no edema or skin discoloration or tenderness  Pelvic:  Bartholin, Urethra, Skene Glands: Within normal limits             Vagina: No gross lesions or discharge second-degree cystocele   Cervix:  absent  Uterus  absent   Adnexa  Without masses or tenderness  Anus and perineum  normal   Rectovaginal  normal sphincter tone without palpated masses or tenderness             Hemoccult  cards presented to the patient to submit to the office for testing.   Assessment/Plan:  61 y.o. female for annual exam  was asymptomatic second-degree cystocele. We discussed about the women's health initiative study begin to taper off her hormone replacement therapy. She had been on Vivelle-Dot 0.05 which she applied twice a week. Were going to begin tapering her down with the next dose 0.0375 transdermally twice a week. The following year we will place her on Vagifem twice a week intravaginally. Patient instructed continue her calcium and vitamin D for osteoporosis prevention and to continue active lifestyle. Fecal occult blood testing cards were provided for her to submit to the office for testing. She will followup with Dr. Neita Carp for her lab work. Next year she will need a colonoscopy in bone density study. We discussed the new screening guidelines for Pap smear. She has never had an abnormal Pap smear in the  past and with her recent hysterectomy she no longer needs Pap smears.    Ok Edwards MD, 11:39 AM 01/08/2012

## 2012-01-08 NOTE — Patient Instructions (Signed)
Prescription in pharmacy. Remember to follow up for fasting labs with Dr. Neita Carp. Next year you need colonoscopy and bone density.

## 2012-02-03 DIAGNOSIS — Z1211 Encounter for screening for malignant neoplasm of colon: Secondary | ICD-10-CM

## 2012-02-04 ENCOUNTER — Other Ambulatory Visit: Payer: Self-pay | Admitting: *Deleted

## 2012-02-04 ENCOUNTER — Other Ambulatory Visit: Payer: Self-pay | Admitting: Gynecology

## 2012-02-04 DIAGNOSIS — Z1211 Encounter for screening for malignant neoplasm of colon: Secondary | ICD-10-CM

## 2012-07-01 ENCOUNTER — Telehealth: Payer: Self-pay | Admitting: *Deleted

## 2012-07-01 ENCOUNTER — Ambulatory Visit (INDEPENDENT_AMBULATORY_CARE_PROVIDER_SITE_OTHER): Payer: Federal, State, Local not specified - PPO | Admitting: Gynecology

## 2012-07-01 ENCOUNTER — Other Ambulatory Visit: Payer: Self-pay | Admitting: Gynecology

## 2012-07-01 ENCOUNTER — Encounter: Payer: Self-pay | Admitting: Gynecology

## 2012-07-01 VITALS — BP 140/82

## 2012-07-01 DIAGNOSIS — R1013 Epigastric pain: Secondary | ICD-10-CM

## 2012-07-01 DIAGNOSIS — R140 Abdominal distension (gaseous): Secondary | ICD-10-CM

## 2012-07-01 DIAGNOSIS — R194 Change in bowel habit: Secondary | ICD-10-CM | POA: Insufficient documentation

## 2012-07-01 DIAGNOSIS — R198 Other specified symptoms and signs involving the digestive system and abdomen: Secondary | ICD-10-CM

## 2012-07-01 DIAGNOSIS — Z79890 Hormone replacement therapy (postmenopausal): Secondary | ICD-10-CM

## 2012-07-01 DIAGNOSIS — Z1211 Encounter for screening for malignant neoplasm of colon: Secondary | ICD-10-CM

## 2012-07-01 DIAGNOSIS — Z780 Asymptomatic menopausal state: Secondary | ICD-10-CM

## 2012-07-01 DIAGNOSIS — R141 Gas pain: Secondary | ICD-10-CM

## 2012-07-01 DIAGNOSIS — K8020 Calculus of gallbladder without cholecystitis without obstruction: Secondary | ICD-10-CM

## 2012-07-01 LAB — URINALYSIS W MICROSCOPIC + REFLEX CULTURE
Bilirubin Urine: NEGATIVE
Ketones, ur: NEGATIVE mg/dL
Specific Gravity, Urine: 1.01 (ref 1.005–1.030)
pH: 7 (ref 5.0–8.0)

## 2012-07-01 MED ORDER — ESTRADIOL 10 MCG VA TABS: 1.0000 | ORAL_TABLET | VAGINAL | Status: DC

## 2012-07-01 MED ORDER — ESTRADIOL 0.025 MG/24HR TD PTTW: 1.0000 | MEDICATED_PATCH | TRANSDERMAL | Status: DC

## 2012-07-01 NOTE — Patient Instructions (Addendum)
Abdominal Pain Abdominal pain can be caused by many things. Your caregiver decides the seriousness of your pain by an examination and possibly blood tests and X-rays. Many cases can be observed and treated at home. Most abdominal pain is not caused by a disease and will probably improve without treatment. However, in many cases, more time must pass before a clear cause of the pain can be found. Before that point, it may not be known if you need more testing, or if hospitalization or surgery is needed. HOME CARE INSTRUCTIONS   Do not take laxatives unless directed by your caregiver.   Take pain medicine only as directed by your caregiver.   Only take over-the-counter or prescription medicines for pain, discomfort, or fever as directed by your caregiver.   Try a clear liquid diet (broth, tea, or water) for as long as directed by your caregiver. Slowly move to a bland diet as tolerated.  SEEK IMMEDIATE MEDICAL CARE IF:  The pain does not go away.    Abdominal or Pelvic Ultrasound An ultrasound is a painless test used to see inside your body.  BEFORE THE PROCEDURE  Other than water, do not eat or drink for 8 to 12 hours before the test.   Follow any other directions from your doctor about eating before the test.  PROCEDURE  The doctor will put gel on your skin. The gel may feel cool.   A wand (transducer) will be moved back and forth on your skin. The wand sends sound waves through your body.   The echoes of the sound waves show up as pictures on a television screen and are recorded.   The room may be dark so your doctor can see the pictures better.   The ultrasound test takes less than 1 hour.  AFTER THE PROCEDURE  You can drive and go back to normal activities.   Ask when your test results will be ready. Make sure you get your test results.  Document Released: 11/01/2010 Document Revised: 09/18/2011 Document Reviewed: 03/20/2011  [facility] Patient Information 2012 [place],  [place].  You have a fever.   You keep throwing up (vomiting).   The pain is felt only in portions of the abdomen. Pain in the right side could possibly be appendicitis. In an adult, pain in the left lower portion of the abdomen could be colitis or diverticulitis.   You pass bloody or black tarry stools.  MAKE SURE YOU:   Understand these instructions.   Will watch your condition.   Will get help right away if you are not doing well or get worse.  Document Released: 07/09/2005 Document Revised: 09/18/2011 Document Reviewed: 05/17/2008 [facility] Patient Information 2012 [place], [place].

## 2012-07-01 NOTE — Telephone Encounter (Signed)
Message copied by Aura Camps on Thu Jul 01, 2012  3:53 PM ------      Message from: Ok Edwards      Created: Thu Jul 01, 2012  3:26 PM       Victorino Dike please schedule upper abdominal ultrasound to rule out cholelithiasis. I need for you to make appointment to see Dr. Elnoria Howard a few days after the ultrasound. See my note. Thanks.

## 2012-07-01 NOTE — Telephone Encounter (Signed)
Left message for pt to call. Ultrasound appointment on 07/06/12 @ 7:30 pm. Dr.Hung appointment on 07/07/12 10:30 am.

## 2012-07-01 NOTE — Telephone Encounter (Signed)
Pt must be NPO after midnight to have ultrasound done.

## 2012-07-01 NOTE — Progress Notes (Signed)
Patient is a 61 year old a presented to the office today stating that she's had right upper quadrant discomfort feeling bloated belching at times and midepigastric discomfort especially after meals. She states immediately after she she has bowel movements. But she has noticed no blood in her stool. She was seen in the office in March 28 for her annual gynecological examination.Patient is being followed by Dr. Neita Carp in [place], [place] who is her primary physician and had been doing her lab work.Patient with prior history of a transvaginal hysterectomy and sling procedure. She has been on hormone replacement therapy now for 6 years. Patient with benign colonic polyps in 2009. She tried beginning with her gastroenterologist but had to wait a month and a half before she could be seen. Patient states at times she feels that her weight has increased. We have been tapering her off the Vivelle-Dot transdermal estrogen she was currently on 0.0375 twice a week.  Review of her records indicated also she was seen in February 11 of this year complaining of low abdominal pressure and bloating as well as right upper quadrant discomfort. She's petrified since her friend had pancreatic cancer. She had an ultrasound done in the office on February 2013 with the following:  Absent uterus from previous hysterectomy. Right left ovary were otherwise normal.  Exam today: Back: No CVA tenderness Abdomen: soft nontender in the lower abdomen but positive Murphy sign. Pelvic: Bartholin urethra Skene was within normal limits Vagina: Second-degree cystocele Vaginal cuff: Intact Bimanual: No palpable masses or tenderness Rectal exam no palpable masses Hemoccult negative  Assessment/plan: Patient with midepigastric pains attributed to meals. Frequent bowel movements after meals. She is going to be sent for an upper abdominal ultrasound to rule out cholelithiasis. We'll have her followup with her gastroenterologist a week  after the ultrasound. We'll or the following labs: Comprehensive metabolic panel, CBC, and urinalysis. She feels at times she has vaginal dryness irritation and burning like sensation. We are going to discontinue her Vivelle-Dot and off for her Vagifem 10 mcg to apply intravaginally 2 times a week. I've explained her that if the GI workup was completely negative and she continues with this discomfort that she will return back to the office for a pelvic ultrasound to look at her ovaries although she had one a few months ago here in the office and was normal and her pelvic examination was essentially unremarkable. Once again we discussed the women's health initiative study in reference to the risk benefits and pros and cons of hormone replacement therapy.

## 2012-07-02 ENCOUNTER — Telehealth: Payer: Self-pay | Admitting: *Deleted

## 2012-07-02 LAB — CBC WITH DIFFERENTIAL/PLATELET
Eosinophils Relative: 1 % (ref 0–5)
HCT: 34.9 % — ABNORMAL LOW (ref 36.0–46.0)
Lymphocytes Relative: 24 % (ref 12–46)
Lymphs Abs: 1.4 10*3/uL (ref 0.7–4.0)
MCV: 89.3 fL (ref 78.0–100.0)
Monocytes Absolute: 0.5 10*3/uL (ref 0.1–1.0)
RBC: 3.91 MIL/uL (ref 3.87–5.11)
WBC: 5.7 10*3/uL (ref 4.0–10.5)

## 2012-07-02 LAB — COMPREHENSIVE METABOLIC PANEL
ALT: 16 U/L (ref 0–35)
CO2: 30 mEq/L (ref 19–32)
Creat: 0.88 mg/dL (ref 0.50–1.10)
Total Bilirubin: 0.6 mg/dL (ref 0.3–1.2)

## 2012-07-02 NOTE — Telephone Encounter (Signed)
61 y.o. female for annual exam was asymptomatic second-degree cystocele. We discussed about the women's health initiative study begin to taper off her hormone replacement therapy. She had been on Vivelle-Dot 0.05 which she applied twice a week. Were going to begin tapering her down with the next dose 0.0375 transdermally twice a week. The following year we will place her on Vagifem twice a week intravaginally. Finish off what ever of the Vivelle-Dot 0.05 mg that she has at home and then start a new dose of the 0.0375 transdermal twice a week.

## 2012-07-02 NOTE — Telephone Encounter (Signed)
Pt wants to not use the Vagifem and wants to titrate off the Vivelle Dot patch. She is on the .375 and wants to go to next lower dose. Please give instructions on titrating off patch. KW

## 2012-07-02 NOTE — Telephone Encounter (Signed)
The vagifem was for next year if she wanted it. Just taper off on the patch per my last note.

## 2012-07-02 NOTE — Telephone Encounter (Signed)
Dr Lily Peer can you clarify. She does not want to use Vagifem. Only the patches and wants to titrate off of them. pls advise

## 2012-07-05 ENCOUNTER — Encounter: Payer: Self-pay | Admitting: Gynecology

## 2012-07-05 NOTE — Telephone Encounter (Signed)
Pt informed with all the below note. 

## 2012-07-06 ENCOUNTER — Ambulatory Visit (HOSPITAL_COMMUNITY)
Admission: RE | Admit: 2012-07-06 | Discharge: 2012-07-06 | Disposition: A | Discharge status: Home / Self Care | Payer: Federal, State, Local not specified - PPO | Source: Ambulatory Visit | Attending: Gynecology | Admitting: Gynecology

## 2012-07-06 DIAGNOSIS — R1033 Periumbilical pain: Secondary | ICD-10-CM | POA: Insufficient documentation

## 2012-07-06 DIAGNOSIS — R1013 Epigastric pain: Secondary | ICD-10-CM

## 2012-07-08 MED ORDER — ESTRADIOL 0.025 MG/24HR TD PTTW: 1.0000 | MEDICATED_PATCH | TRANSDERMAL | Status: DC

## 2012-07-08 NOTE — Telephone Encounter (Signed)
Pt informed and requested the next lower dose of Vivelle so she can titrate off. And verbally ok with Dr Glenetta Hew. She will not use the vagifem. She wants off all Estrogen.

## 2012-09-23 ENCOUNTER — Encounter: Payer: Self-pay | Admitting: Gynecology

## 2012-09-27 ENCOUNTER — Encounter (INDEPENDENT_AMBULATORY_CARE_PROVIDER_SITE_OTHER): Payer: Self-pay

## 2012-09-27 ENCOUNTER — Encounter (INDEPENDENT_AMBULATORY_CARE_PROVIDER_SITE_OTHER): Payer: Self-pay | Admitting: Surgery

## 2012-10-04 ENCOUNTER — Ambulatory Visit (INDEPENDENT_AMBULATORY_CARE_PROVIDER_SITE_OTHER): Payer: Federal, State, Local not specified - PPO | Admitting: Surgery

## 2012-10-04 ENCOUNTER — Encounter (INDEPENDENT_AMBULATORY_CARE_PROVIDER_SITE_OTHER): Payer: Self-pay | Admitting: Surgery

## 2012-10-04 VITALS — BP 120/64 | HR 68 | Temp 97.2°F | Resp 20 | Ht 67.0 in | Wt 171.0 lb

## 2012-10-04 DIAGNOSIS — K8020 Calculus of gallbladder without cholecystitis without obstruction: Secondary | ICD-10-CM

## 2012-10-04 NOTE — Progress Notes (Signed)
Patient ID: Tara Bates, female   DOB: 1951-01-28, 61 y.o.   MRN: [id number]  Chief Complaint  Patient presents with  . New Evaluation    evak GB w/strones    HPI Tara Bates is a 61 y.o. female.   HPI This is a pleasant female referred by Dr. Elnoria Howard for evaluation of abdominal pain. She has been told she has had irritable bowel for many years. Most recently however she has had attacks of periumbilical domino pain as well as pain to the right side and discomfort in her shoulder. This has occurred after fatty meals. She has had nausea but no emesis. She has intermittent loose bowel movements as well. The pain is moderate in intensity and described as sharp. She denies jaundice. Past Medical History  Diagnosis Date  . Colon polyps   . Iron deficiency   . Memory loss   . Urinary incontinence   . IBS (irritable bowel syndrome)     Past Surgical History  Procedure Date  . Knee arthroscopy     left  . Bladder surgery   . Back surgery     LOWER BACK  . Replacement total knee     LEFT  . Vaginal hysterectomy     with sling  . Toe surgery 1994    left small toe    Family History  Problem Relation Age of Onset  . Cancer Mother     LUNG  . Cancer Father     MESOTHELIOMA  . Cancer Sister     MELANOMA    Social History History  Substance Use Topics  . Smoking status: Never Smoker   . Smokeless tobacco: Never Used  . Alcohol Use: No    Allergies  Allergen Reactions  . Codeine Nausea And Vomiting  . Morphine And Related Nausea And Vomiting    Current Outpatient Prescriptions  Medication Sig Dispense Refill  . B Complex Vitamins (VITAMIN B COMPLEX PO) Take by mouth.      Marland Kitchen CALCIUM PO Take by mouth.      . Cholecalciferol (VITAMIN D-3 PO) Take by mouth.      . estradiol (VIVELLE-DOT) 0.025 MG/24HR Place 1 patch onto the skin 2 (two) times a week.  8 patch  3  . Estradiol 10 MCG TABS Place 1 tablet (10 mcg total) vaginally 2 (two) times a week.  8 tablet  11  .  Methylcellulose, Laxative, (CITRUCEL PO) Take by mouth.      . Multiple Vitamin (MULTIVITAMIN) capsule Take 1 capsule by mouth daily.      . Probiotic Product (ALIGN PO) Take by mouth.      . SELENIUM PO Take by mouth.      Marland Kitchen VITAMIN E PO Take by mouth.        Review of Systems Review of Systems  Constitutional: Negative for fever, chills and unexpected weight change.  HENT: Negative for hearing loss, congestion, sore throat, trouble swallowing and voice change.   Eyes: Negative for visual disturbance.  Respiratory: Negative for cough and wheezing.   Cardiovascular: Negative for chest pain, palpitations and leg swelling.  Gastrointestinal: Positive for nausea, abdominal pain, diarrhea and abdominal distention. Negative for vomiting, constipation, blood in stool and anal bleeding.  Genitourinary: Negative for hematuria, vaginal bleeding and difficulty urinating.  Musculoskeletal: Positive for arthralgias.  Skin: Negative for rash and wound.  Neurological: Negative for seizures, syncope and headaches.  Hematological: Negative for adenopathy. Does not bruise/bleed easily.  Psychiatric/Behavioral: Negative for  confusion.    Blood pressure 120/64, pulse 68, temperature 97.2 F (36.2 C), temperature source Temporal, resp. rate 20, height 5\' 7"  (1.702 m), weight 171 lb (77.565 kg).  Physical Exam Physical Exam  Constitutional: She is oriented to person, place, and time. She appears well-developed and well-nourished. No distress.  HENT:  Head: Normocephalic and atraumatic.  Right Ear: External ear normal.  Left Ear: External ear normal.  Nose: Nose normal.  Mouth/Throat: Oropharynx is clear and moist. No oropharyngeal exudate.  Eyes: Conjunctivae normal are normal. Pupils are equal, round, and reactive to light. Right eye exhibits no discharge. Left eye exhibits no discharge. No scleral icterus.  Neck: Normal range of motion. Neck supple. No tracheal deviation present. No thyromegaly  present.  Cardiovascular: Normal rate, regular rhythm, normal heart sounds and intact distal pulses.   No murmur heard. Pulmonary/Chest: Effort normal and breath sounds normal. No respiratory distress. She has no wheezes.  Abdominal: Soft. Bowel sounds are normal. She exhibits no distension. There is no tenderness. There is no rebound.  Musculoskeletal: Normal range of motion. She exhibits no edema and no tenderness.  Lymphadenopathy:    She has no cervical adenopathy.  Neurological: She is alert and oriented to person, place, and time.  Skin: Skin is warm and dry. No rash noted. She is not diaphoretic. No erythema.  Psychiatric: Her behavior is normal. Judgment normal.    Data Reviewed I have reviewed the notes from Dr. Elnoria Howard. I have also reviewed her ultrasound report.  Assessment    Symptomatic cholelithiasis    Plan    Laparoscopic cholecystectomy is recommended. I discussed this with her in detail. I discussed the surgery including the risks. These risks include but are not limited to bleeding, infection, bile duct injury, bile leak, injury to other structures, need to convert to an open procedure, the testis may not resolve all of her symptoms, etc. I also discussed postoperative recovery. She wishes to proceed with surgery which will be scheduled       [name] A 10/04/2012, 10:55 AM

## 2012-10-18 ENCOUNTER — Encounter (HOSPITAL_COMMUNITY): Payer: Self-pay | Admitting: Pharmacy Technician

## 2012-10-19 NOTE — Patient Instructions (Signed)
20 RANITA STJULIEN  10/19/2012   Your procedure is scheduled on: 10/28/12   Report to [facility] at 100pm  Call this number if you have problems the morning of surgery: [phone]   Remember:   Do not eat food:After Midnight.  May have clear liquids:until 0900am then npo .  Clear liquids include soda, tea, black coffee, apple or grape juice, broth.  Take these medicines the morning of surgery with A SIP OF WATER:    Do not wear jewelry, make-up or nail polish.  Do not wear lotions, powders, or perfumes.    Do not shave 48 hours prior to surgery. .  Do not bring valuables to the hospital.  Contacts, dentures or bridgework may not be worn into surgery.      Patients discharged the day of surgery will not be allowed to drive home.  Name and phone number of your driver:   SEE [facility]G INSTRUCTION SHEET    Please read over the following fact sheets that you were given: MRSA Information, coughing and deep breathing exercises, leg exercises               Failure to comply with these instructions may result in cancellation of your surgery.                Patient Signature _____________________________             Nurse Signature ______________________________

## 2012-10-20 ENCOUNTER — Encounter (HOSPITAL_COMMUNITY)
Admission: RE | Admit: 2012-10-20 | Discharge: 2012-10-20 | Disposition: A | Discharge status: Home / Self Care | Payer: Federal, State, Local not specified - PPO | Source: Ambulatory Visit | Attending: Surgery | Admitting: Surgery

## 2012-10-20 ENCOUNTER — Encounter (HOSPITAL_COMMUNITY): Payer: Self-pay

## 2012-10-20 HISTORY — DX: Unspecified osteoarthritis, unspecified site: M19.90

## 2012-10-20 HISTORY — DX: Other specified postprocedural states: Z98.890

## 2012-10-20 HISTORY — DX: Anemia, unspecified: D64.9

## 2012-10-20 HISTORY — DX: Other specified postprocedural states: R11.2

## 2012-10-20 HISTORY — DX: Urinary tract infection, site not specified: N39.0

## 2012-10-20 LAB — CBC
Platelets: 307 10*3/uL (ref 150–400)
RBC: 4.27 MIL/uL (ref 3.87–5.11)
RDW: 15 % (ref 11.5–15.5)
WBC: 4.7 10*3/uL (ref 4.0–10.5)

## 2012-10-20 LAB — SURGICAL PCR SCREEN: Staphylococcus aureus: NEGATIVE

## 2012-10-27 NOTE — H&P (Signed)
Patient ID: Tara Bates, female DOB: [birth year], 62 y.o. MRN: [id number]  Chief Complaint   Patient presents with   .  New Evaluation     evak GB w/strones    HPI  Tara Bates is a 62 y.o. female.  HPI  This is a pleasant female referred by Dr. Elnoria Howard for evaluation of abdominal pain. She has been told she has had irritable bowel for many years. Most recently however she has had attacks of periumbilical domino pain as well as pain to the right side and discomfort in her shoulder. This has occurred after fatty meals. She has had nausea but no emesis. She has intermittent loose bowel movements as well. The pain is moderate in intensity and described as sharp. She denies jaundice.  Past Medical History   Diagnosis  Date   .  Colon polyps    .  Iron deficiency    .  Memory loss    .  Urinary incontinence    .  IBS (irritable bowel syndrome)     Past Surgical History   Procedure  Date   .  Knee arthroscopy      left   .  Bladder surgery    .  Back surgery      LOWER BACK   .  Replacement total knee      LEFT   .  Vaginal hysterectomy      with sling   .  Toe surgery  1994     left small toe    Family History   Problem  Relation  Age of Onset   .  Cancer  Mother       LUNG    .  Cancer  Father       MESOTHELIOMA    .  Cancer  Sister       MELANOMA    Social History  History   Substance Use Topics   .  Smoking status:  Never Smoker   .  Smokeless tobacco:  Never Used   .  Alcohol Use:  No    Allergies   Allergen  Reactions   .  Codeine  Nausea And Vomiting   .  Morphine And Related  Nausea And Vomiting    Current Outpatient Prescriptions   Medication  Sig  Dispense  Refill   .  B Complex Vitamins (VITAMIN B COMPLEX PO)  Take by mouth.     Marland Kitchen  CALCIUM PO  Take by mouth.     .  Cholecalciferol (VITAMIN D-3 PO)  Take by mouth.     .  estradiol (VIVELLE-DOT) 0.025 MG/24HR  Place 1 patch onto the skin 2 (two) times a week.  8 patch  3   .  Estradiol 10 MCG  TABS  Place 1 tablet (10 mcg total) vaginally 2 (two) times a week.  8 tablet  11   .  Methylcellulose, Laxative, (CITRUCEL PO)  Take by mouth.     .  Multiple Vitamin (MULTIVITAMIN) capsule  Take 1 capsule by mouth daily.     .  Probiotic Product (ALIGN PO)  Take by mouth.     .  SELENIUM PO  Take by mouth.     Marland Kitchen  VITAMIN E PO  Take by mouth.      Review of Systems  Review of Systems  Constitutional: Negative for fever, chills and unexpected weight change.  HENT: Negative for hearing loss, congestion, sore throat, trouble swallowing  and voice change.  Eyes: Negative for visual disturbance.  Respiratory: Negative for cough and wheezing.  Cardiovascular: Negative for chest pain, palpitations and leg swelling.  Gastrointestinal: Positive for nausea, abdominal pain, diarrhea and abdominal distention. Negative for vomiting, constipation, blood in stool and anal bleeding.  Genitourinary: Negative for hematuria, vaginal bleeding and difficulty urinating.  Musculoskeletal: Positive for arthralgias.  Skin: Negative for rash and wound.  Neurological: Negative for seizures, syncope and headaches.  Hematological: Negative for adenopathy. Does not bruise/bleed easily.  Psychiatric/Behavioral: Negative for confusion.   Blood pressure 120/64, pulse 68, temperature 97.2 F (36.2 C), temperature source Temporal, resp. rate 20, height 5\' 7"  (1.702 m), weight 171 lb (77.565 kg).  Physical Exam  Physical Exam  Constitutional: She is oriented to person, place, and time. She appears well-developed and well-nourished. No distress.  HENT:  Head: Normocephalic and atraumatic.  Right Ear: External ear normal.  Left Ear: External ear normal.  Nose: Nose normal.  Mouth/Throat: Oropharynx is clear and moist. No oropharyngeal exudate.  Eyes: Conjunctivae normal are normal. Pupils are equal, round, and reactive to light. Right eye exhibits no discharge. Left eye exhibits no discharge. No scleral icterus.  Neck:  Normal range of motion. Neck supple. No tracheal deviation present. No thyromegaly present.  Cardiovascular: Normal rate, regular rhythm, normal heart sounds and intact distal pulses.  No murmur heard.  Pulmonary/Chest: Effort normal and breath sounds normal. No respiratory distress. She has no wheezes.  Abdominal: Soft. Bowel sounds are normal. She exhibits no distension. There is no tenderness. There is no rebound.  Musculoskeletal: Normal range of motion. She exhibits no edema and no tenderness.  Lymphadenopathy:  She has no cervical adenopathy.  Neurological: She is alert and oriented to person, place, and time.  Skin: Skin is warm and dry. No rash noted. She is not diaphoretic. No erythema.  Psychiatric: Her behavior is normal. Judgment normal.   Data Reviewed  I have reviewed the notes from Dr. Elnoria Howard. I have also reviewed her ultrasound report.  Assessment   Symptomatic cholelithiasis   Plan   Laparoscopic cholecystectomy is recommended. I discussed this with her in detail. I discussed the surgery including the risks. These risks include but are not limited to bleeding, infection, bile duct injury, bile leak, injury to other structures, need to convert to an open procedure, the testis may not resolve all of her symptoms, etc. I also discussed postoperative recovery. She wishes to proceed with surgery which will be scheduled

## 2012-10-28 ENCOUNTER — Encounter (HOSPITAL_COMMUNITY)
Admission: RE | Disposition: A | Discharge status: Home / Self Care | Payer: Self-pay | Source: Ambulatory Visit | Attending: Surgery

## 2012-10-28 ENCOUNTER — Encounter (HOSPITAL_COMMUNITY): Payer: Self-pay | Admitting: Anesthesiology

## 2012-10-28 ENCOUNTER — Encounter (HOSPITAL_COMMUNITY): Payer: Self-pay | Admitting: *Deleted

## 2012-10-28 ENCOUNTER — Ambulatory Visit (HOSPITAL_COMMUNITY)
Admission: RE | Admit: 2012-10-28 | Discharge: 2012-10-28 | Disposition: A | Discharge status: Home / Self Care | Payer: Federal, State, Local not specified - PPO | Source: Ambulatory Visit | Attending: Surgery | Admitting: Surgery

## 2012-10-28 ENCOUNTER — Ambulatory Visit (HOSPITAL_COMMUNITY): Payer: Federal, State, Local not specified - PPO | Admitting: Anesthesiology

## 2012-10-28 DIAGNOSIS — K8010 Calculus of gallbladder with chronic cholecystitis without obstruction: Secondary | ICD-10-CM

## 2012-10-28 DIAGNOSIS — Z01812 Encounter for preprocedural laboratory examination: Secondary | ICD-10-CM | POA: Insufficient documentation

## 2012-10-28 HISTORY — PX: CHOLECYSTECTOMY: SHX55

## 2012-10-28 SURGERY — LAPAROSCOPIC CHOLECYSTECTOMY
Anesthesia: General | Site: Abdomen | Wound class: Clean Contaminated

## 2012-10-28 MED ORDER — PROPOFOL 10 MG/ML IV BOLUS
INTRAVENOUS | Status: DC | PRN
  Administered 2012-10-28: 160 mg via INTRAVENOUS

## 2012-10-28 MED ORDER — EPHEDRINE SULFATE 50 MG/ML IJ SOLN
INTRAMUSCULAR | Status: DC | PRN
  Administered 2012-10-28: 10 mg via INTRAVENOUS

## 2012-10-28 MED ORDER — FENTANYL CITRATE 0.05 MG/ML IJ SOLN
INTRAMUSCULAR | Status: DC | PRN
  Administered 2012-10-28 (×3): 50 ug via INTRAVENOUS

## 2012-10-28 MED ORDER — BUPIVACAINE HCL 0.5 % IJ SOLN
INTRAMUSCULAR | Status: AC
  Filled 2012-10-28: qty 1

## 2012-10-28 MED ORDER — FENTANYL CITRATE 0.05 MG/ML IJ SOLN: 25.0000 ug | INTRAMUSCULAR | Status: DC | PRN

## 2012-10-28 MED ORDER — SODIUM CHLORIDE 0.9 % IV SOLN: 250.0000 mL | INTRAVENOUS | Status: DC | PRN

## 2012-10-28 MED ORDER — SODIUM CHLORIDE 0.9 % IJ SOLN: 3.0000 mL | INTRAMUSCULAR | Status: DC | PRN

## 2012-10-28 MED ORDER — LACTATED RINGERS IV SOLN
INTRAVENOUS | Status: DC | PRN
  Administered 2012-10-28: 14:00:00 via INTRAVENOUS

## 2012-10-28 MED ORDER — LIDOCAINE HCL (CARDIAC) 20 MG/ML IV SOLN
INTRAVENOUS | Status: DC | PRN
  Administered 2012-10-28: 100 mg via INTRAVENOUS

## 2012-10-28 MED ORDER — GLYCOPYRROLATE 0.2 MG/ML IJ SOLN
INTRAMUSCULAR | Status: DC | PRN
  Administered 2012-10-28: 0.4 mg via INTRAVENOUS

## 2012-10-28 MED ORDER — LACTATED RINGERS IV SOLN
INTRAVENOUS | Status: DC | PRN
  Administered 2012-10-28: 1000 mL

## 2012-10-28 MED ORDER — MIDAZOLAM HCL 5 MG/5ML IJ SOLN
INTRAMUSCULAR | Status: DC | PRN
  Administered 2012-10-28: 2 mg via INTRAVENOUS

## 2012-10-28 MED ORDER — CEFAZOLIN SODIUM-DEXTROSE 2-3 GM-% IV SOLR
INTRAVENOUS | Status: AC
  Filled 2012-10-28: qty 50

## 2012-10-28 MED ORDER — DEXAMETHASONE SODIUM PHOSPHATE 10 MG/ML IJ SOLN
INTRAMUSCULAR | Status: DC | PRN
  Administered 2012-10-28: 10 mg via INTRAVENOUS

## 2012-10-28 MED ORDER — ACETAMINOPHEN 325 MG PO TABS: 650.0000 mg | ORAL_TABLET | ORAL | Status: DC | PRN

## 2012-10-28 MED ORDER — OXYCODONE HCL 5 MG PO TABS
5.0000 mg | ORAL_TABLET | ORAL | Status: DC | PRN
  Administered 2012-10-28: 5 mg via ORAL
  Filled 2012-10-28: qty 1

## 2012-10-28 MED ORDER — ACETAMINOPHEN 10 MG/ML IV SOLN
INTRAVENOUS | Status: AC
  Filled 2012-10-28: qty 100

## 2012-10-28 MED ORDER — HYDROCODONE-ACETAMINOPHEN 5-325 MG PO TABS: 1.0000 | ORAL_TABLET | ORAL | Status: DC | PRN

## 2012-10-28 MED ORDER — SUCCINYLCHOLINE CHLORIDE 20 MG/ML IJ SOLN
INTRAMUSCULAR | Status: DC | PRN
  Administered 2012-10-28: 100 mg via INTRAVENOUS

## 2012-10-28 MED ORDER — NEOSTIGMINE METHYLSULFATE 1 MG/ML IJ SOLN
INTRAMUSCULAR | Status: DC | PRN
  Administered 2012-10-28: 3 mg via INTRAVENOUS

## 2012-10-28 MED ORDER — ONDANSETRON HCL 4 MG/2ML IJ SOLN
INTRAMUSCULAR | Status: DC | PRN
  Administered 2012-10-28: 4 mg via INTRAVENOUS

## 2012-10-28 MED ORDER — ACETAMINOPHEN 10 MG/ML IV SOLN
INTRAVENOUS | Status: DC | PRN
  Administered 2012-10-28: 1000 mg via INTRAVENOUS

## 2012-10-28 MED ORDER — BUPIVACAINE HCL (PF) 0.5 % IJ SOLN
INTRAMUSCULAR | Status: DC | PRN
  Administered 2012-10-28: 20 mL

## 2012-10-28 MED ORDER — ONDANSETRON HCL 4 MG/2ML IJ SOLN: 4.0000 mg | Freq: Four times a day (QID) | INTRAMUSCULAR | Status: DC | PRN

## 2012-10-28 MED ORDER — ACETAMINOPHEN 650 MG RE SUPP
650.0000 mg | RECTAL | Status: DC | PRN
  Filled 2012-10-28: qty 1

## 2012-10-28 MED ORDER — ROCURONIUM BROMIDE 100 MG/10ML IV SOLN
INTRAVENOUS | Status: DC | PRN
  Administered 2012-10-28: 10 mg via INTRAVENOUS

## 2012-10-28 MED ORDER — KETOROLAC TROMETHAMINE 30 MG/ML IJ SOLN
INTRAMUSCULAR | Status: DC | PRN
  Administered 2012-10-28: 30 mg via INTRAVENOUS

## 2012-10-28 MED ORDER — SODIUM CHLORIDE 0.9 % IJ SOLN: 3.0000 mL | Freq: Two times a day (BID) | INTRAMUSCULAR | Status: DC

## 2012-10-28 MED ORDER — LACTATED RINGERS IV SOLN: INTRAVENOUS | Status: DC

## 2012-10-28 MED ORDER — CEFAZOLIN SODIUM-DEXTROSE 2-3 GM-% IV SOLR
2.0000 g | INTRAVENOUS | Status: AC
  Administered 2012-10-28: 2 g via INTRAVENOUS

## 2012-10-28 SURGICAL SUPPLY — 38 items
APL SKNCLS STERI-STRIP NONHPOA (GAUZE/BANDAGES/DRESSINGS) ×1
APPLIER CLIP 5 13 M/L LIGAMAX5 (MISCELLANEOUS) ×2
APR CLP MED LRG 5 ANG JAW (MISCELLANEOUS) ×1
BAG SPEC RTRVL LRG 6X4 10 (ENDOMECHANICALS) ×1
BANDAGE ADHESIVE 1X3 (GAUZE/BANDAGES/DRESSINGS) ×5 IMPLANT
BENZOIN TINCTURE PRP APPL 2/3 (GAUZE/BANDAGES/DRESSINGS) ×2 IMPLANT
CANISTER SUCTION 2500CC (MISCELLANEOUS) ×2 IMPLANT
CHLORAPREP W/TINT 26ML (MISCELLANEOUS) ×2 IMPLANT
CLIP APPLIE 5 13 M/L LIGAMAX5 (MISCELLANEOUS) IMPLANT
CLOTH BEACON ORANGE TIMEOUT ST (SAFETY) ×2 IMPLANT
CLSR STERI-STRIP ANTIMIC 1/2X4 (GAUZE/BANDAGES/DRESSINGS) ×1 IMPLANT
COVER MAYO STAND STRL (DRAPES) IMPLANT
DECANTER SPIKE VIAL GLASS SM (MISCELLANEOUS) ×2 IMPLANT
DRAPE C-ARM 42X72 X-RAY (DRAPES) IMPLANT
DRAPE LAPAROSCOPIC ABDOMINAL (DRAPES) ×2 IMPLANT
DRAPE UTILITY XL STRL (DRAPES) ×1 IMPLANT
ELECT REM PT RETURN 9FT ADLT (ELECTROSURGICAL) ×2
ELECTRODE REM PT RTRN 9FT ADLT (ELECTROSURGICAL) ×1 IMPLANT
GLOVE BIOGEL PI IND STRL 7.0 (GLOVE) ×1 IMPLANT
GLOVE BIOGEL PI INDICATOR 7.0 (GLOVE) ×1
GLOVE SURG SIGNA 7.5 PF LTX (GLOVE) ×4 IMPLANT
GOWN STRL NON-REIN LRG LVL3 (GOWN DISPOSABLE) ×2 IMPLANT
GOWN STRL REIN XL XLG (GOWN DISPOSABLE) ×4 IMPLANT
HEMOSTAT SURGICEL 4X8 (HEMOSTASIS) IMPLANT
KIT BASIN OR (CUSTOM PROCEDURE TRAY) ×2 IMPLANT
NS IRRIG 1000ML POUR BTL (IV SOLUTION) ×1 IMPLANT
POUCH SPECIMEN RETRIEVAL 10MM (ENDOMECHANICALS) ×1 IMPLANT
SET CHOLANGIOGRAPH MIX (MISCELLANEOUS) IMPLANT
SET IRRIG TUBING LAPAROSCOPIC (IRRIGATION / IRRIGATOR) ×2 IMPLANT
SOLUTION ANTI FOG 6CC (MISCELLANEOUS) ×2 IMPLANT
STRIP CLOSURE SKIN 1/2X4 (GAUZE/BANDAGES/DRESSINGS) ×2 IMPLANT
SUT MNCRL AB 4-0 PS2 18 (SUTURE) ×2 IMPLANT
TOWEL OR 17X26 10 PK STRL BLUE (TOWEL DISPOSABLE) ×2 IMPLANT
TRAY LAP CHOLE (CUSTOM PROCEDURE TRAY) ×2 IMPLANT
TROCAR BLADELESS OPT 5 75 (ENDOMECHANICALS) ×4 IMPLANT
TROCAR SLEEVE XCEL 5X75 (ENDOMECHANICALS) ×2 IMPLANT
TROCAR XCEL BLUNT TIP 100MML (ENDOMECHANICALS) ×2 IMPLANT
TUBING INSUFFLATION 10FT LAP (TUBING) ×2 IMPLANT

## 2012-10-28 NOTE — Transfer of Care (Signed)
Immediate Anesthesia Transfer of Care Note  Patient: Tara Bates  Procedure(s) Performed: Procedure(s) (LRB) with comments: LAPAROSCOPIC CHOLECYSTECTOMY (N/A)  Patient Location: PACU  Anesthesia Type:General  Level of Consciousness: sedated  Airway & Oxygen Therapy: Patient Spontanous Breathing and Patient connected to face mask oxygen  Post-op Assessment: Report given to PACU RN and Post -op Vital signs reviewed and stable  Post vital signs: Reviewed and stable  Complications: No apparent anesthesia complications

## 2012-10-28 NOTE — Op Note (Signed)
Laparoscopic Cholecystectomy Procedure Note  Indications: This patient presents with symptomatic gallbladder disease and will undergo laparoscopic cholecystectomy.  Pre-operative Diagnosis: Calculus of gallbladder without mention of cholecystitis or obstruction  Post-operative Diagnosis: Same  Surgeon: Abigail Miyamoto A   Assistants: 0  Anesthesia: General endotracheal anesthesia  ASA Class: 1  Procedure Details  The patient was seen again in the Holding Room. The risks, benefits, complications, treatment options, and expected outcomes were discussed with the patient. The possibilities of reaction to medication, pulmonary aspiration, perforation of viscus, bleeding, recurrent infection, finding a normal gallbladder, the need for additional procedures, failure to diagnose a condition, the possible need to convert to an open procedure, and creating a complication requiring transfusion or operation were discussed with the patient. The likelihood of improving the patient's symptoms with return to their baseline status is good.  The patient and/or family concurred with the proposed plan, giving informed consent. The site of surgery properly noted. The patient was taken to Operating Room, identified as [name] and the procedure verified as Laparoscopic Cholecystectomy with Intraoperative Cholangiogram. A Time Out was held and the above information confirmed.  Prior to the induction of general anesthesia, antibiotic prophylaxis was administered. General endotracheal anesthesia was then administered and tolerated well. After the induction, the abdomen was prepped with Chloraprep and draped in sterile fashion. The patient was positioned in the supine position.  Local anesthetic agent was injected into the skin near the umbilicus and an incision made. We dissected down to the abdominal fascia with blunt dissection.  The fascia was incised vertically and we entered the peritoneal cavity bluntly.   A pursestring suture of 0-Vicryl was placed around the fascial opening.  The Hasson cannula was inserted and secured with the stay suture.  Pneumoperitoneum was then created with CO2 and tolerated well without any adverse changes in the patient's vital signs. An 11-mm port was placed in the subxiphoid position.  Two 5-mm ports were placed in the right upper quadrant. All skin incisions were infiltrated with a local anesthetic agent before making the incision and placing the trocars.   We positioned the patient in reverse Trendelenburg, tilted slightly to the patient's left.  The gallbladder was identified, the fundus grasped and retracted cephalad. Adhesions were lysed bluntly and with the electrocautery where indicated, taking care not to injure any adjacent organs or viscus. The infundibulum was grasped and retracted laterally, exposing the peritoneum overlying the triangle of Calot. This was then divided and exposed in a blunt fashion. The cystic duct was clearly identified and bluntly dissected circumferentially. A critical view of the cystic duct and cystic artery was obtained.  The cystic duct was then ligated with clips and divided. The cystic artery was, dissected free, ligated with clips and divided as well.   The gallbladder was dissected from the liver bed in retrograde fashion with the electrocautery. The gallbladder was removed and placed in an Endocatch sac. The liver bed was irrigated and inspected. Hemostasis was achieved with the electrocautery. Copious irrigation was utilized and was repeatedly aspirated until clear.  The gallbladder and Endocatch sac were then removed through the umbilical port site.  The pursestring suture was used to close the umbilical fascia.    We again inspected the right upper quadrant for hemostasis.  Pneumoperitoneum was released as we removed the trocars.  4-0 Monocryl was used to close the skin.   Benzoin, steri-strips, and clean dressings were applied. The  patient was then extubated and brought to the recovery room  in stable condition. Instrument, sponge, and needle counts were correct at closure and at the conclusion of the case.   Findings: Cholecystitis with Cholelithiasis  Estimated Blood Loss: Minimal         Drains: 0         Specimens: Gallbladder           Complications: None; patient tolerated the procedure well.         Disposition: PACU - hemodynamically stable.         Condition: stable

## 2012-10-28 NOTE — Interval H&P Note (Signed)
History and Physical Interval Note: no change in H and P  10/28/2012 1:00 PM  Tara Bates  has presented today for surgery, with the diagnosis of symptomatic choleithiasis  The various methods of treatment have been discussed with the patient and family. After consideration of risks, benefits and other options for treatment, the patient has consented to  Procedure(s) (LRB) with comments: LAPAROSCOPIC CHOLECYSTECTOMY (N/A) as a surgical intervention .  The patient's history has been reviewed, patient examined, no change in status, stable for surgery.  I have reviewed the patient's chart and labs.  Questions were answered to the patient's satisfaction.     [name]

## 2012-10-28 NOTE — Anesthesia Preprocedure Evaluation (Addendum)
Anesthesia Evaluation  Patient identified by MRN, date of birth, ID band Patient awake    Reviewed: Allergy & Precautions, H&P , NPO status , Patient's Chart, lab work & pertinent test results  History of Anesthesia Complications (+) PONV  Airway Mallampati: II TM Distance: >3 FB Neck ROM: full    Dental No notable dental hx. (+) Teeth Intact and Dental Advisory Given   Pulmonary neg pulmonary ROS,  breath sounds clear to auscultation  Pulmonary exam normal       Cardiovascular Exercise Tolerance: Good negative cardio ROS  Rhythm:regular Rate:Normal     Neuro/Psych negative neurological ROS  negative psych ROS   GI/Hepatic negative GI ROS, Neg liver ROS,   Endo/Other  negative endocrine ROS  Renal/GU negative Renal ROS  negative genitourinary   Musculoskeletal   Abdominal   Peds  Hematology negative hematology ROS (+) anemia ,   Anesthesia Other Findings   Reproductive/Obstetrics negative OB ROS                          Anesthesia Physical Anesthesia Plan  ASA: I  Anesthesia Plan: General   Post-op Pain Management:    Induction: Intravenous  Airway Management Planned: Oral ETT  Additional Equipment:   Intra-op Plan:   Post-operative Plan: Extubation in OR  Informed Consent: I have reviewed the patients History and Physical, chart, labs and discussed the procedure including the risks, benefits and alternatives for the proposed anesthesia with the patient or authorized representative who has indicated his/her understanding and acceptance.   Dental Advisory Given  Plan Discussed with: CRNA and Surgeon  Anesthesia Plan Comments:         Anesthesia Quick Evaluation

## 2012-10-28 NOTE — Anesthesia Postprocedure Evaluation (Signed)
  Anesthesia Post-op Note  Patient: Tara Bates  Procedure(s) Performed: Procedure(s) (LRB): LAPAROSCOPIC CHOLECYSTECTOMY (N/A)  Patient Location: PACU  Anesthesia Type: General  Level of Consciousness: awake and alert   Airway and Oxygen Therapy: Patient Spontanous Breathing  Post-op Pain: mild  Post-op Assessment: Post-op Vital signs reviewed, Patient's Cardiovascular Status Stable, Respiratory Function Stable, Patent Airway and No signs of Nausea or vomiting  Last Vitals:  Filed Vitals:   10/28/12 1430  BP: 124/69  Pulse: 50  Temp:   Resp: 16    Post-op Vital Signs: stable   Complications: No apparent anesthesia complications

## 2012-10-29 ENCOUNTER — Encounter (HOSPITAL_COMMUNITY): Payer: Self-pay | Admitting: Surgery

## 2012-11-16 ENCOUNTER — Encounter (INDEPENDENT_AMBULATORY_CARE_PROVIDER_SITE_OTHER): Payer: Self-pay | Admitting: Surgery

## 2012-11-16 ENCOUNTER — Ambulatory Visit (INDEPENDENT_AMBULATORY_CARE_PROVIDER_SITE_OTHER): Payer: Federal, State, Local not specified - PPO | Admitting: Surgery

## 2012-11-16 VITALS — BP 101/64 | HR 62 | Temp 96.2°F | Resp 12 | Ht 67.0 in | Wt 173.6 lb

## 2012-11-16 DIAGNOSIS — Z09 Encounter for follow-up examination after completed treatment for conditions other than malignant neoplasm: Secondary | ICD-10-CM

## 2012-11-16 NOTE — Progress Notes (Signed)
Subjective:     Patient ID: Tara Bates, female   DOB: July 12, 1951, 62 y.o.   MRN: [id number]  HPI  She is here for her first postop visit status post laparoscopic cholecystectomy. She is doing well and has no complaints. Review of Systems     Objective:   Physical Exam    Her incisions are healing well. The final pathology showed chronic cholecystitis gallstones Assessment:     Patient stable postop    Plan:     She may resume normal activity. I will see her back as needed

## 2012-11-27 ENCOUNTER — Other Ambulatory Visit: Payer: Self-pay

## 2013-01-12 ENCOUNTER — Encounter: Payer: Federal, State, Local not specified - PPO | Admitting: Gynecology

## 2013-02-03 ENCOUNTER — Encounter: Payer: Federal, State, Local not specified - PPO | Admitting: Gynecology

## 2013-02-03 ENCOUNTER — Ambulatory Visit (INDEPENDENT_AMBULATORY_CARE_PROVIDER_SITE_OTHER): Payer: Self-pay | Admitting: Gynecology

## 2013-02-03 ENCOUNTER — Encounter: Payer: Self-pay | Admitting: Gynecology

## 2013-02-03 VITALS — BP 126/78 | Ht 66.0 in | Wt 171.0 lb

## 2013-02-03 DIAGNOSIS — R140 Abdominal distension (gaseous): Secondary | ICD-10-CM

## 2013-02-03 DIAGNOSIS — R141 Gas pain: Secondary | ICD-10-CM

## 2013-02-03 DIAGNOSIS — R1013 Epigastric pain: Secondary | ICD-10-CM

## 2013-02-03 DIAGNOSIS — R635 Abnormal weight gain: Secondary | ICD-10-CM

## 2013-02-03 DIAGNOSIS — R142 Eructation: Secondary | ICD-10-CM

## 2013-02-03 DIAGNOSIS — Z01419 Encounter for gynecological examination (general) (routine) without abnormal findings: Secondary | ICD-10-CM

## 2013-02-03 LAB — CBC WITH DIFFERENTIAL/PLATELET
Eosinophils Absolute: 0.1 10*3/uL (ref 0.0–0.7)
Eosinophils Relative: 3 % (ref 0–5)
Hemoglobin: 13.6 g/dL (ref 12.0–15.0)
Lymphocytes Relative: 39 % (ref 12–46)
Lymphs Abs: 1.4 10*3/uL (ref 0.7–4.0)
MCH: 29.1 pg (ref 26.0–34.0)
MCV: 88.2 fL (ref 78.0–100.0)
Monocytes Relative: 9 % (ref 3–12)
Neutrophils Relative %: 48 % (ref 43–77)
Platelets: 302 10*3/uL (ref 150–400)
RBC: 4.67 MIL/uL (ref 3.87–5.11)
WBC: 3.5 10*3/uL — ABNORMAL LOW (ref 4.0–10.5)

## 2013-02-03 LAB — COMPREHENSIVE METABOLIC PANEL
ALT: 17 U/L (ref 0–35)
AST: 24 U/L (ref 0–37)
BUN: 13 mg/dL (ref 6–23)
Calcium: 10 mg/dL (ref 8.4–10.5)
Creat: 0.8 mg/dL (ref 0.50–1.10)
Total Bilirubin: 1 mg/dL (ref 0.3–1.2)

## 2013-02-03 LAB — THYROID PANEL WITH TSH: T3 Uptake: 34 % (ref 22.5–37.0)

## 2013-02-03 LAB — LIPID PANEL
Cholesterol: 152 mg/dL (ref 0–200)
HDL: 67 mg/dL (ref >39)
Total CHOL/HDL Ratio: 2.3 Ratio
VLDL: 13 mg/dL (ref 0–40)

## 2013-02-03 NOTE — Progress Notes (Addendum)
Tara Bates 1951/09/29 [id number]   History:    62 y.o.  for annual gyn exam with several complaints today. Patient been complaining of midepigastric pain, belching, bloating and weight gain. Patient had a laparoscopic cholecystectomy in January of this year by Dr. Rayburn Ma. Patient has been seen in the past by Dr. Elnoria Howard gastroenterologist who had done her colonoscopy 4 years ago as well as an EGD. Patient with prior history transvaginal hysterectomy along with suburethral sling. Patient was on hormone replacement therapy until November 2013. Patient had been on hormone replacement therapy for 6 years. Patient states now that after her cholecystectomy her vasomotor symptoms have increased which coincides with her stopping her hormone replacement therapy. Patient's last bone density study March 2012. Last mammogram December 2013. Patient states that her primary physician has administer her shingles vaccine and Tdap. She is due for her Pneumovax vaccine.  Past medical history,surgical history, family history and social history were all reviewed and documented in the EPIC chart.  Gynecologic History No LMP recorded. Patient has had a hysterectomy. Contraception: status post hysterectomy Last Pap: 2012. Results were: normal Last mammogram: 2013. Results were: normal  Obstetric History OB History   Grav Para Term Preterm Abortions TAB SAB Ect Mult Living   2 2 2       2      # Outc Date GA Lbr Len/2nd Wgt Sex Del Anes PTL Lv   1 TRM     F SVD  No Yes   2 TRM     M SVD  No Yes       ROS: A ROS was performed and pertinent positives and negatives are included in the history.  GENERAL: No fevers or chills. HEENT: No change in vision, no earache, sore throat or sinus congestion. NECK: No pain or stiffness. CARDIOVASCULAR: No chest pain or pressure. No palpitations. PULMONARY: No shortness of breath, cough or wheeze. GASTROINTESTINAL: No abdominal pain, nausea, vomiting or diarrhea, melena or bright  red blood per rectum. GENITOURINARY: No urinary frequency, urgency, hesitancy or dysuria. MUSCULOSKELETAL: No joint or muscle pain, no back pain, no recent trauma. DERMATOLOGIC: No rash, no itching, no lesions. ENDOCRINE: No polyuria, polydipsia, no heat or cold intolerance. No recent change in weight. HEMATOLOGICAL: No anemia or easy bruising or bleeding. NEUROLOGIC: No headache, seizures, numbness, tingling or weakness. PSYCHIATRIC: No depression, no loss of interest in normal activity or change in sleep pattern.     Exam: chaperone present  BP 126/78  Ht 5\' 6"  (1.676 m)  Wt 171 lb (77.565 kg)  BMI 27.61 kg/m2  Body mass index is 27.61 kg/(m^2).  General appearance : Well developed well nourished female. No acute distress HEENT: Neck supple, trachea midline, no carotid bruits, no thyroidmegaly Lungs: Clear to auscultation, no rhonchi or wheezes, or rib retractions  Heart: Regular rate and rhythm, no murmurs or gallops Breast:Examined in sitting and supine position were symmetrical in appearance, no palpable masses or tenderness,  no skin retraction, no nipple inversion, no nipple discharge, no skin discoloration, no axillary or supraclavicular lymphadenopathy Abdomen: no palpable masses or tenderness, no rebound or guarding Extremities: no edema or skin discoloration or tenderness  Pelvic:  Bartholin, Urethra, Skene Glands: Within normal limits             Vagina: No gross lesions or discharge  Cervix:absent  Uterus Absent  Adnexa  Without masses or tenderness  Anus and perineum  normal   Rectovaginal  normal sphincter tone without palpated masses or  tenderness             Hemoccult Cards provided     Assessment/Plan:  62 y.o. female for annual exam will be instructed to return to the office in one to 2 weeks for ultrasound to assess her adnexa because of her none  specific bloating sensation to rule out any ovarian pathology. The following labs will be ordered: Comprehensive   metabolic panel, amylase, H. Pylori, CBC, TSH, as well as urinalysis. She was provided with Hemoccult card to summit   to the office for testing. It was recommended that she followup up  with her gastroenterologist if all the above tests are normal. She does not want to go hormone  replacement therapy and for  now so she will use soy products and try to exercise more regularly. No Pap smear done today the new guidelines discussed. Patient will schedule her bone density study for June.    Ok Edwards MD, 10:10 AM 02/03/2013

## 2013-02-04 LAB — URINALYSIS W MICROSCOPIC + REFLEX CULTURE
Bilirubin Urine: NEGATIVE
Casts: NONE SEEN
Crystals: NONE SEEN
Glucose, UA: NEGATIVE mg/dL
pH: 7 (ref 5.0–8.0)

## 2013-02-05 LAB — URINE CULTURE

## 2013-02-07 ENCOUNTER — Other Ambulatory Visit: Payer: Self-pay | Admitting: Gynecology

## 2013-02-07 MED ORDER — NITROFURANTOIN MONOHYD MACRO 100 MG PO CAPS: 100.0000 mg | ORAL_CAPSULE | Freq: Two times a day (BID) | ORAL | Status: DC

## 2013-02-09 ENCOUNTER — Other Ambulatory Visit: Payer: Self-pay | Admitting: Gynecology

## 2013-02-09 ENCOUNTER — Telehealth: Payer: Self-pay

## 2013-02-09 MED ORDER — CIPROFLOXACIN HCL 250 MG PO TABS: 250.0000 mg | ORAL_TABLET | Freq: Two times a day (BID) | ORAL | Status: DC

## 2013-02-09 NOTE — Telephone Encounter (Signed)
Patient was prescribed Macrobid on 02/07/13.  She said she has taken 4 capsules. She is complaining that it is making her dizzy and the right side of her head is hurting.  She asked if you could prescribe something else?

## 2013-02-09 NOTE — Telephone Encounter (Signed)
D/C Macrobid. Take Cipro 250 mg BID for three days # 6

## 2013-02-09 NOTE — Telephone Encounter (Signed)
Patient advised. Rx e-scribed. 

## 2013-02-18 ENCOUNTER — Ambulatory Visit (INDEPENDENT_AMBULATORY_CARE_PROVIDER_SITE_OTHER): Payer: Federal, State, Local not specified - PPO

## 2013-02-18 ENCOUNTER — Encounter: Payer: Self-pay | Admitting: Gynecology

## 2013-02-18 ENCOUNTER — Ambulatory Visit (INDEPENDENT_AMBULATORY_CARE_PROVIDER_SITE_OTHER): Payer: Federal, State, Local not specified - PPO | Admitting: Gynecology

## 2013-02-18 DIAGNOSIS — M549 Dorsalgia, unspecified: Secondary | ICD-10-CM

## 2013-02-18 DIAGNOSIS — R142 Eructation: Secondary | ICD-10-CM

## 2013-02-18 DIAGNOSIS — R141 Gas pain: Secondary | ICD-10-CM

## 2013-02-18 DIAGNOSIS — N390 Urinary tract infection, site not specified: Secondary | ICD-10-CM

## 2013-02-18 DIAGNOSIS — R140 Abdominal distension (gaseous): Secondary | ICD-10-CM

## 2013-02-18 NOTE — Progress Notes (Signed)
62 year old who was seen in the office April of this year complaining of midepigastric discomfort and bloating and weight gain. Patient with past laparoscopic cholecystectomy in January of this year by Dr. Rayburn Ma. Patient has been seen in the past by Dr. Elnoria Howard gastroenterologist who had done her colonoscopy 4 years ago as well as an EGD. Patient with prior history transvaginal hysterectomy along with suburethral sling. Patient was on hormone replacement therapy until November 2013. Patient had been on hormone replacement therapy for 6 years.  Patient presented to the office today for ultrasound. Today's ultrasound demonstrated the following: Absent uterus and prior hysterectomy. Ovaries appeared to be normal. No free fluid in the cul-de-sac. On last visit patient had a comprehensive metabolic panel, lipid profile, CBC, thyroid function test and H. Pylori all in the normal range. She was recently treated for urinary tract infection and we will check her urinalysis today.  Assessment/plan: Patient continues with upper abdominal discomfort. She will be asked to followup with Dr. Elnoria Howard for further evaluation. Her discomfort appears to be non-gynecological in nature. We'll wait for the results of urinalysis.

## 2013-02-19 LAB — URINALYSIS W MICROSCOPIC + REFLEX CULTURE
Bacteria, UA: NONE SEEN
Bilirubin Urine: NEGATIVE
Casts: NONE SEEN
Crystals: NONE SEEN
Glucose, UA: NEGATIVE mg/dL
Hgb urine dipstick: NEGATIVE
Ketones, ur: NEGATIVE mg/dL
Nitrite: NEGATIVE
Specific Gravity, Urine: 1.007 (ref 1.005–1.030)
pH: 7.5 (ref 5.0–8.0)

## 2013-02-25 ENCOUNTER — Other Ambulatory Visit: Payer: Self-pay | Admitting: Anesthesiology

## 2013-02-25 DIAGNOSIS — Z1211 Encounter for screening for malignant neoplasm of colon: Secondary | ICD-10-CM

## 2013-03-22 ENCOUNTER — Other Ambulatory Visit: Payer: Self-pay | Admitting: Gynecology

## 2013-03-22 DIAGNOSIS — Z13820 Encounter for screening for osteoporosis: Secondary | ICD-10-CM

## 2013-04-05 ENCOUNTER — Ambulatory Visit (INDEPENDENT_AMBULATORY_CARE_PROVIDER_SITE_OTHER): Payer: Federal, State, Local not specified - PPO

## 2013-04-05 DIAGNOSIS — Z13820 Encounter for screening for osteoporosis: Secondary | ICD-10-CM

## 2013-08-18 ENCOUNTER — Other Ambulatory Visit: Payer: Self-pay

## 2013-09-29 ENCOUNTER — Encounter: Payer: Self-pay | Admitting: Gynecology

## 2014-01-18 ENCOUNTER — Encounter: Payer: Self-pay | Admitting: Obstetrics & Gynecology

## 2014-02-06 ENCOUNTER — Ambulatory Visit (INDEPENDENT_AMBULATORY_CARE_PROVIDER_SITE_OTHER): Payer: Federal, State, Local not specified - PPO | Admitting: Gynecology

## 2014-02-06 ENCOUNTER — Encounter: Payer: Self-pay | Admitting: Gynecology

## 2014-02-06 VITALS — BP 124/70 | Ht 66.25 in | Wt 168.0 lb

## 2014-02-06 DIAGNOSIS — Z8639 Personal history of other endocrine, nutritional and metabolic disease: Secondary | ICD-10-CM

## 2014-02-06 DIAGNOSIS — Z01419 Encounter for gynecological examination (general) (routine) without abnormal findings: Secondary | ICD-10-CM

## 2014-02-06 DIAGNOSIS — N952 Postmenopausal atrophic vaginitis: Secondary | ICD-10-CM

## 2014-02-06 DIAGNOSIS — R6882 Decreased libido: Secondary | ICD-10-CM

## 2014-02-06 MED ORDER — NONFORMULARY OR COMPOUNDED ITEM: Status: DC

## 2014-02-06 NOTE — Progress Notes (Signed)
Tara LothMyra E Bates 07/24/1951 [id number]   History:    63 y.o.  for annual gyn exam who has been complaining of thinning of her hair. Patient also has stopped her transdermal estrogen replacement therapy thinking that this was the cause of her losing her hair. Patient states her vasomotor symptoms are very foreign in between but her main concerns vaginal dryness and decreased libido. She recently saw her PCP Dr. Neita CarpSasser who did all her lab work with the exception of testosterone level and vitamin D level. Patient also has been followed by the urologist Dr. Patsi Searsannenbaum because history of recurrent UTIs.Patient with prior history transvaginal hysterectomy along with suburethral sling. Patient was on hormone replacement therapy until November 2013. Patient normal bone density study in 2014.   Patient with history laparoscopic cholecystectomy in 2014. Her gastroenterologist that her colonoscopy this December 2014 with normal. Patient with prior history of colon polyps.  Past medical history,surgical history, family history and social history were all reviewed and documented in the EPIC chart.  Gynecologic History No LMP recorded. Patient has had a hysterectomy. Contraception: post menopausal status Last Pap: 2012. Results were: normal Last mammogram: 2014. Results were: normal  Obstetric History OB History  Gravida Para Term Preterm AB SAB TAB Ectopic Multiple Living  2 2 2       2     # Outcome Date GA Lbr Len/2nd Weight Sex Delivery Anes PTL Lv  2 TRM     M SVD  N Y  1 TRM     F SVD  N Y       ROS: A ROS was performed and pertinent positives and negatives are included in the history.  GENERAL: No fevers or chills. HEENT: No change in vision, no earache, sore throat or sinus congestion. NECK: No pain or stiffness. CARDIOVASCULAR: No chest pain or pressure. No palpitations. PULMONARY: No shortness of breath, cough or wheeze. GASTROINTESTINAL: No abdominal pain, nausea, vomiting or diarrhea,  melena or bright red blood per rectum. GENITOURINARY: No urinary frequency, urgency, hesitancy or dysuria. MUSCULOSKELETAL: No joint or muscle pain, no back pain, no recent trauma. DERMATOLOGIC: No rash, no itching, no lesions. ENDOCRINE: No polyuria, polydipsia, no heat or cold intolerance. No recent change in weight. HEMATOLOGICAL: No anemia or easy bruising or bleeding. NEUROLOGIC: No headache, seizures, numbness, tingling or weakness. PSYCHIATRIC: No depression, no loss of interest in normal activity or change in sleep pattern.     Exam: chaperone present  BP 124/70  Ht 5' 6.25" (1.683 m)  Wt 168 lb (76.204 kg)  BMI 26.90 kg/m2  Body mass index is 26.9 kg/(m^2).  General appearance : Well developed well nourished female. No acute distress HEENT: Neck supple, trachea midline, no carotid bruits, no thyroidmegaly Lungs: Clear to auscultation, no rhonchi or wheezes, or rib retractions  Heart: Regular rate and rhythm, no murmurs or gallops Breast:Examined in sitting and supine position were symmetrical in appearance, no palpable masses or tenderness,  no skin retraction, no nipple inversion, no nipple discharge, no skin discoloration, no axillary or supraclavicular lymphadenopathy Abdomen: no palpable masses or tenderness, no rebound or guarding Extremities: no edema or skin discoloration or tenderness  Pelvic:  Bartholin, Urethra, Skene Glands: Within normal limits             Vagina: No gross lesions or discharge, atrophic changes  Cervix: Absent  Uterus  Absent  Adnexa  Without masses or tenderness  Anus and perineum  normal   Rectovaginal  normal sphincter  tone without palpated masses or tenderness             Hemoccult colonoscopy December 2014     Assessment/Plan:  63 y.o. female for annual exam who will be placed on vaginal estrogen twice a week for vaginal atrophy and dyspareunia. The risks benefits and pros and cons were discussed. She will be referred to the dermatologist  for further assessment of her hair loss. Patient with normal TSH recently done by her PCP. Patient also had a normal CBC and comprehensive metabolic panel and lipid profile. We're going to check a serum testosterone level. Patient with past history vitamin D deficiency will check her vitamin D level today. Pap smear was not done today in accordance to new guidelines. We discussed importance of calcium and vitamin D and regular exercise for osteoporosis prevention. We discussed importance of monthly self breast examination.  Note: This dictation was prepared with  Dragon/digital dictation along withSmart phrase technology. Any transcriptional errors that result from this process are unintentional.   Ok EdwardsJuan H Fernandez MD, 12:37 PM 02/06/2014

## 2014-02-07 LAB — TESTOSTERONE: TESTOSTERONE: 39 ng/dL (ref 10–70)

## 2014-02-07 LAB — VITAMIN D 25 HYDROXY (VIT D DEFICIENCY, FRACTURES): VIT D 25 HYDROXY: 48 ng/mL (ref 30–89)

## 2014-02-28 ENCOUNTER — Encounter: Payer: Self-pay | Admitting: Gynecology

## 2014-03-27 ENCOUNTER — Telehealth: Payer: Self-pay | Admitting: *Deleted

## 2014-03-27 NOTE — Telephone Encounter (Signed)
Yes, she can apply weekly and monitor symptoms and if persist then d/c

## 2014-03-27 NOTE — Telephone Encounter (Signed)
Pt informed with the below. 

## 2014-03-27 NOTE — Telephone Encounter (Signed)
Pt was prescribed vaginal estrogen twice a week for vaginal atrophy and dyspareunia on OV 02/06/14. Pt said she feels moody and c/o thinning hair wants to stop vaginal cream asked if she should taper off cream? Please advise

## 2014-08-14 ENCOUNTER — Encounter: Payer: Self-pay | Admitting: Gynecology

## 2014-11-08 ENCOUNTER — Encounter: Payer: Self-pay | Admitting: Gynecology

## 2015-02-08 ENCOUNTER — Encounter: Payer: Federal, State, Local not specified - PPO | Admitting: Gynecology

## 2015-02-21 ENCOUNTER — Encounter: Payer: Federal, State, Local not specified - PPO | Admitting: Gynecology

## 2015-02-26 ENCOUNTER — Encounter: Payer: Self-pay | Admitting: Gynecology

## 2015-02-26 ENCOUNTER — Ambulatory Visit (INDEPENDENT_AMBULATORY_CARE_PROVIDER_SITE_OTHER): Payer: Federal, State, Local not specified - PPO | Admitting: Gynecology

## 2015-02-26 VITALS — BP 120/78 | Ht 66.0 in | Wt 170.0 lb

## 2015-02-26 DIAGNOSIS — Z780 Asymptomatic menopausal state: Secondary | ICD-10-CM | POA: Diagnosis not present

## 2015-02-26 DIAGNOSIS — Z01419 Encounter for gynecological examination (general) (routine) without abnormal findings: Secondary | ICD-10-CM | POA: Diagnosis not present

## 2015-02-26 NOTE — Progress Notes (Signed)
Tara Bates 05/22/1951 [id number]   History:    64 y.o.  for annual gyn exam with no complaints today. Patient is no longer on hormone replacement therapy. She states her vasomotor symptoms are very far and in between. Patient's PCP is Dr. Fara ChutePaul Sasser who is been doing her blood work. Patient in the past as seen Dr. Patsi Searsannenbaum urologist because of recurrent urinary tract infections. Patient with prior history transvaginal hysterectomy along with suburethral sling. Patient was on hormone replacement therapy from 2013 until 2015. Patient had a colonoscopy in 2015 which was benign although several years ago she had benign polyps removed. She had a normal bone density study in 2014. Her shingles and Tdap vaccine are up-to-date.  Past medical history,surgical history, family history and social history were all reviewed and documented in the EPIC chart.  Gynecologic History No LMP recorded. Patient has had a hysterectomy. Contraception: post menopausal status Last Pap: 2012. Results were: normal Last mammogram: 2016. Results were: normal  Obstetric History OB History  Gravida Para Term Preterm AB SAB TAB Ectopic Multiple Living  2 2 2       2     # Outcome Date GA Lbr Len/2nd Weight Sex Delivery Anes PTL Lv  2 Term     M Vag-Spont  N Y  1 Term     F Vag-Spont  N Y       ROS: A ROS was performed and pertinent positives and negatives are included in the history.  GENERAL: No fevers or chills. HEENT: No change in vision, no earache, sore throat or sinus congestion. NECK: No pain or stiffness. CARDIOVASCULAR: No chest pain or pressure. No palpitations. PULMONARY: No shortness of breath, cough or wheeze. GASTROINTESTINAL: No abdominal pain, nausea, vomiting or diarrhea, melena or bright red blood per rectum. GENITOURINARY: No urinary frequency, urgency, hesitancy or dysuria. MUSCULOSKELETAL: No joint or muscle pain, no back pain, no recent trauma. DERMATOLOGIC: No rash, no itching, no lesions.  ENDOCRINE: No polyuria, polydipsia, no heat or cold intolerance. No recent change in weight. HEMATOLOGICAL: No anemia or easy bruising or bleeding. NEUROLOGIC: No headache, seizures, numbness, tingling or weakness. PSYCHIATRIC: No depression, no loss of interest in normal activity or change in sleep pattern.     Exam: chaperone present  BP 120/78 mmHg  Ht 5\' 6"  (1.676 m)  Wt 170 lb (77.111 kg)  BMI 27.45 kg/m2  Body mass index is 27.45 kg/(m^2).  General appearance : Well developed well nourished female. No acute distress HEENT: Eyes: no retinal hemorrhage or exudates,  Neck supple, trachea midline, no carotid bruits, no thyroidmegaly Lungs: Clear to auscultation, no rhonchi or wheezes, or rib retractions  Heart: Regular rate and rhythm, no murmurs or gallops Breast:Examined in sitting and supine position were symmetrical in appearance, no palpable masses or tenderness,  no skin retraction, no nipple inversion, no nipple discharge, no skin discoloration, no axillary or supraclavicular lymphadenopathy Abdomen: no palpable masses or tenderness, no rebound or guarding Extremities: no edema or skin discoloration or tenderness  Pelvic:  Bartholin, Urethra, Skene Glands: Within normal limits             Vagina: No gross lesions or discharge, atrophic changes  Cervix: Absent  Uterus  absent  Adnexa  Without masses or tenderness  Anus and perineum  normal   Rectovaginal  normal sphincter tone without palpated masses or tenderness             Hemoccult PCP provides  Assessment/Plan:  64 y.o. female for annual exam who is menopausal with no vasomotor symptoms. No prior history of abnormal Pap smear prior hysterectomy no further indication for Pap smears according to guidelines. PCP will be doing her blood work. She was reminded take calcium vitamin D for osteoporosis prevention. Patient to schedule bone density study here in the office in the next few weeks.   Ok EdwardsFERNANDEZ,JUAN H MD, 2:36  PM 02/26/2015

## 2015-02-26 NOTE — Patient Instructions (Signed)

## 2015-03-23 ENCOUNTER — Other Ambulatory Visit: Payer: Self-pay | Admitting: Gynecology

## 2015-03-23 DIAGNOSIS — Z13820 Encounter for screening for osteoporosis: Secondary | ICD-10-CM

## 2015-05-01 ENCOUNTER — Ambulatory Visit (INDEPENDENT_AMBULATORY_CARE_PROVIDER_SITE_OTHER): Payer: Federal, State, Local not specified - PPO

## 2015-05-01 DIAGNOSIS — Z13820 Encounter for screening for osteoporosis: Secondary | ICD-10-CM

## 2015-05-07 ENCOUNTER — Telehealth: Payer: Self-pay | Admitting: *Deleted

## 2015-05-07 NOTE — Telephone Encounter (Signed)
Pt called asked if I could fax order to Dr.Sasser office for labs PTH, CA, Vitamin D per dexa report on 05/01/15. [phone]

## 2015-11-15 ENCOUNTER — Encounter: Payer: Self-pay | Admitting: Gynecology

## 2016-02-27 ENCOUNTER — Encounter: Payer: Self-pay | Admitting: Gynecology

## 2016-02-27 ENCOUNTER — Ambulatory Visit (INDEPENDENT_AMBULATORY_CARE_PROVIDER_SITE_OTHER): Payer: Federal, State, Local not specified - PPO | Admitting: Gynecology

## 2016-02-27 VITALS — BP 130/82 | Ht 66.0 in | Wt 172.0 lb

## 2016-02-27 DIAGNOSIS — R232 Flushing: Secondary | ICD-10-CM | POA: Diagnosis not present

## 2016-02-27 DIAGNOSIS — Z01419 Encounter for gynecological examination (general) (routine) without abnormal findings: Secondary | ICD-10-CM | POA: Diagnosis not present

## 2016-02-27 NOTE — Progress Notes (Signed)
Tara LothMyra E Italiano 02/04/1951 [id number]   History:    65 y.o.  for annual gyn exam who stopped her hormone replacement therapy approximately 2 years ago and had been on for 6 years. She began experiencing vasomotor symptoms and brought over-the-count Estroven which she takes one daily and has helped her symptoms. one daily Patient's PCP is Dr. Fara ChutePaul Sasser who is been doing her blood work. Patient in the past as seen Dr. Patsi Searsannenbaum urologist because of recurrent urinary tract infections. Patient with prior history transvaginal hysterectomy along with suburethral sling. Patient was on hormone replacement therapy from 2013 until 2015. Patient had a colonoscopy in 2015 which was benign although several years ago she had benign polyps removed. She had a normal bone density study in 2014 and 2016. Patient  prior to her hysterectomy as had no history of any abnormal Pap smears.  Past medical history,surgical history, family history and social history were all reviewed and documented in the EPIC chart.  Gynecologic History No LMP recorded. Patient has had a hysterectomy. Contraception: post menopausal status Last Pap: 2012. Results were: normal Last mammogram: 2017. Results were: normal  Obstetric History OB History  Gravida Para Term Preterm AB SAB TAB Ectopic Multiple Living  2 2 2       2     # Outcome Date GA Lbr Len/2nd Weight Sex Delivery Anes PTL Lv  2 Term     M Vag-Spont  N Y  1 Term     F Vag-Spont  N Y       ROS: A ROS was performed and pertinent positives and negatives are included in the history.  GENERAL: No fevers or chills. HEENT: No change in vision, no earache, sore throat or sinus congestion. NECK: No pain or stiffness. CARDIOVASCULAR: No chest pain or pressure. No palpitations. PULMONARY: No shortness of breath, cough or wheeze. GASTROINTESTINAL: No abdominal pain, nausea, vomiting or diarrhea, melena or bright red blood per rectum. GENITOURINARY: No urinary frequency, urgency,  hesitancy or dysuria. MUSCULOSKELETAL: No joint or muscle pain, no back pain, no recent trauma. DERMATOLOGIC: No rash, no itching, no lesions. ENDOCRINE: No polyuria, polydipsia, no heat or cold intolerance. No recent change in weight. HEMATOLOGICAL: No anemia or easy bruising or bleeding. NEUROLOGIC: No headache, seizures, numbness, tingling or weakness. PSYCHIATRIC: No depression, no loss of interest in normal activity or change in sleep pattern.     Exam: chaperone present  BP 130/82 mmHg  Ht 5\' 6"  (1.676 m)  Wt 172 lb (78.019 kg)  BMI 27.77 kg/m2  Body mass index is 27.77 kg/(m^2).  General appearance : Well developed well nourished female. No acute distress HEENT: Eyes: no retinal hemorrhage or exudates,  Neck supple, trachea midline, no carotid bruits, no thyroidmegaly Lungs: Clear to auscultation, no rhonchi or wheezes, or rib retractions  Heart: Regular rate and rhythm, no murmurs or gallops Breast:Examined in sitting and supine position were symmetrical in appearance, no palpable masses or tenderness,  no skin retraction, no nipple inversion, no nipple discharge, no skin discoloration, no axillary or supraclavicular lymphadenopathy Abdomen: no palpable masses or tenderness, no rebound or guarding Extremities: no edema or skin discoloration or tenderness  Pelvic:  Bartholin, Urethra, Skene Glands: Within normal limits             Vagina: No gross lesions or discharge, atrophic changes  Cervix: Absent  Uterus absent  Adnexa  Without masses or tenderness  Anus and perineum  normal   Rectovaginal  normal sphincter  tone without palpated masses or tenderness             Hemoccult PCP provides     Assessment/Plan:  65 y.o. female for annual exam with menopausal symptoms taking Estroven risk benefits and pros and con we discussed I recommended using peppermint oral to apply behind her ear as needed for vasomotor symptoms. Pap smear no longer needed. Patient's bone density studies  next year. Her PCP is been doing her blood work. We discussed importance of calcium vitamin D and weightbearing exercises. We discussed importance of monthly breast exams.   Ok Edwards MD, 2:04 PM 02/27/2016

## 2016-02-27 NOTE — Patient Instructions (Signed)
Hormone Therapy At menopause, your body begins making less estrogen and progesterone hormones. This causes the body to stop having menstrual periods. This is because estrogen and progesterone hormones control your periods and menstrual cycle. A lack of estrogen may cause symptoms such as:  Hot flushes (or hot flashes).  Vaginal dryness.  Dry skin.  Loss of sex drive.  Risk of bone loss (osteoporosis). When this happens, you may choose to take hormone therapy to get back the estrogen lost during menopause. When the hormone estrogen is given alone, it is usually referred to as ET (Estrogen Therapy). When the hormone progestin is combined with estrogen, it is generally called HT (Hormone Therapy). This was formerly known as hormone replacement therapy (HRT). Your caregiver can help you make a decision on what will be best for you. The decision to use HT seems to change often as new studies are done. Many studies do not agree on the benefits of hormone replacement therapy. LIKELY BENEFITS OF HT INCLUDE PROTECTION FROM:  Hot Flushes (also called hot flashes) - A hot flush is a sudden feeling of heat that spreads over the face and body. The skin may redden like a blush. It is connected with sweats and sleep disturbance. Women going through menopause may have hot flushes a few times a month or several times per day depending on the woman.  Osteoporosis (bone loss) - Estrogen helps guard against bone loss. After menopause, a woman's bones slowly lose calcium and become weak and brittle. As a result, bones are more likely to break. The hip, wrist, and spine are affected most often. Hormone therapy can help slow bone loss after menopause. Weight bearing exercise and taking calcium with vitamin D also can help prevent bone loss. There are also medications that your caregiver can prescribe that can help prevent osteoporosis.  Vaginal dryness - Loss of estrogen causes changes in the vagina. Its lining may  become thin and dry. These changes can cause pain and bleeding during sexual intercourse. Dryness can also lead to infections. This can cause burning and itching. (Vaginal estrogen treatment can help relieve pain, itching, and dryness.)  Urinary tract infections are more common after menopause because of lack of estrogen. Some women also develop urinary incontinence because of low estrogen levels in the vagina and bladder.  Possible other benefits of estrogen include a positive effect on mood and short-term memory in women. RISKS AND COMPLICATIONS  Using estrogen alone without progesterone causes the lining of the uterus to grow. This increases the risk of lining of the uterus (endometrial) cancer. Your caregiver should give another hormone called progestin if you have a uterus.  Women who take combined (estrogen and progestin) HT appear to have an increased risk of breast cancer. The risk appears to be small, but increases throughout the time that HT is taken.  Combined therapy also makes the breast tissue slightly denser which makes it harder to read mammograms (breast X-rays).  Combined, estrogen and progesterone therapy can be taken together every day, in which case there may be spotting of blood. HT therapy can be taken cyclically in which case you will have menstrual periods. Cyclically means HT is taken for a set amount of days, then not taken, then this process is repeated.  HT may increase the risk of stroke, heart attack, breast cancer and forming blood clots in your leg.  Transdermal estrogen (estrogen that is absorbed through the skin with a patch or a cream) may have better results with:    Cholesterol.  Blood pressure.  Blood clots. Having the following conditions may indicate you should not have HT:  Endometrial cancer.  Liver disease.  Breast cancer.  Heart disease.  History of blood clots.  Stroke. TREATMENT   If you choose to take HT and have a uterus, usually  estrogen and progestin are prescribed.  Your caregiver will help you decide the best way to take the medications.  Possible ways to take estrogen include:  Pills.  Patches.  Gels.  Sprays.  Vaginal estrogen cream, rings and tablets.  It is best to take the lowest dose possible that will help your symptoms and take them for the shortest period of time that you can.  Hormone therapy can help relieve some of the problems (symptoms) that affect women at menopause. Before making a decision about HT, talk to your caregiver about what is best for you. Be well informed and comfortable with your decisions. HOME CARE INSTRUCTIONS   Follow your caregivers advice when taking the medications.  A Pap test is done to screen for cervical cancer.  The first Pap test should be done at age 21.  Between ages 21 and 29, Pap tests are repeated every 2 years.  Beginning at age 30, you are advised to have a Pap test every 3 years as long as the past 3 Pap tests have been normal.  Some women have medical problems that increase the chance of getting cervical cancer. Talk to your caregiver about these problems. It is especially important to talk to your caregiver if a new problem develops soon after your last Pap test. In these cases, your caregiver may recommend more frequent screening and Pap tests.  The above recommendations are the same for women who have or have not gotten the vaccine for HPV (human papillomavirus).  If you had a hysterectomy for a problem that was not a cancer or a condition that could lead to cancer, then you no longer need Pap tests. However, even if you no longer need a Pap test, a regular exam is a good idea to make sure no other problems are starting.  If you are between ages 65 and 70, and you have had normal Pap tests going back 10 years, you no longer need Pap tests. However, even if you no longer need a Pap test, a regular exam is a good idea to make sure no other problems  are starting.  If you have had past treatment for cervical cancer or a condition that could lead to cancer, you need Pap tests and screening for cancer for at least 20 years after your treatment.  If Pap tests have been discontinued, risk factors (such as a new sexual partner)need to be re-assessed to determine if screening should be resumed.  Some women may need screenings more often if they are at high risk for cervical cancer.  Get mammograms done as per the advice of your caregiver. SEEK IMMEDIATE MEDICAL CARE IF:  You develop abnormal vaginal bleeding.  You have pain or swelling in your legs, shortness of breath, or chest pain.  You develop dizziness or headaches.  You have lumps or changes in your breasts or armpits.  You have slurred speech.  You develop weakness or numbness of your arms or legs.  You have pain, burning, or bleeding when urinating.  You develop abdominal pain.   This information is not intended to replace advice given to you by your health care provider. Make sure you discuss any questions   you have with your health care provider.   Document Released: 06/28/2003 Document Revised: 02/13/2015 Document Reviewed: 04/02/2015 Elsevier Interactive Patient Education 2016 [employer] Menopause Menopause is the normal time of life when menstrual periods stop completely. Menopause is complete when you have missed 12 consecutive menstrual periods. It usually occurs between the ages of 48 years and 55 years. Very rarely does a woman develop menopause before the age of 40 years. At menopause, your ovaries stop producing the female hormones estrogen and progesterone. This can cause undesirable symptoms and also affect your health. Sometimes the symptoms may occur 4-5 years before the menopause begins. There is no relationship between menopause and:  Oral contraceptives.  Number of children you had.  Race.  The age your menstrual periods started (menarche). Heavy  smokers and very thin women may develop menopause earlier in life. CAUSES  The ovaries stop producing the female hormones estrogen and progesterone.  Other causes include:  Surgery to remove both ovaries.  The ovaries stop functioning for no known reason.  Tumors of the pituitary gland in the brain.  Medical disease that affects the ovaries and hormone production.  Radiation treatment to the abdomen or pelvis.  Chemotherapy that affects the ovaries. SYMPTOMS   Hot flashes.  Night sweats.  Decrease in sex drive.  Vaginal dryness and thinning of the vagina causing painful intercourse.  Dryness of the skin and developing wrinkles.  Headaches.  Tiredness.  Irritability.  Memory problems.  Weight gain.  Bladder infections.  Hair growth of the face and chest.  Infertility. More serious symptoms include:  Loss of bone (osteoporosis) causing breaks (fractures).  Depression.  Hardening and narrowing of the arteries (atherosclerosis) causing heart attacks and strokes. DIAGNOSIS   When the menstrual periods have stopped for 12 straight months.  Physical exam.  Hormone studies of the blood. TREATMENT  There are many treatment choices and nearly as many questions about them. The decisions to treat or not to treat menopausal changes is an individual choice made with your health care provider. Your health care provider can discuss the treatments with you. Together, you can decide which treatment will work best for you. Your treatment choices may include:   Hormone therapy (estrogen and progesterone).  Non-hormonal medicines.  Treating the individual symptoms with medicine (for example antidepressants for depression).  Herbal medicines that may help specific symptoms.  Counseling by a psychiatrist or psychologist.  Group therapy.  Lifestyle changes including:  Eating healthy.  Regular exercise.  Limiting caffeine and alcohol.  Stress management and  meditation.  No treatment. HOME CARE INSTRUCTIONS   Take the medicine your health care provider gives you as directed.  Get plenty of sleep and rest.  Exercise regularly.  Eat a diet that contains calcium (good for the bones) and soy products (acts like estrogen hormone).  Avoid alcoholic beverages.  Do not smoke.  If you have hot flashes, dress in layers.  Take supplements, calcium, and vitamin D to strengthen bones.  You can use over-the-counter lubricants or moisturizers for vaginal dryness.  Group therapy is sometimes very helpful.  Acupuncture may be helpful in some cases. SEEK MEDICAL CARE IF:   You are not sure you are in menopause.  You are having menopausal symptoms and need advice and treatment.  You are still having menstrual periods after age 55 years.  You have pain with intercourse.  Menopause is complete (no menstrual period for 12 months) and you develop vaginal bleeding.  You need a referral to   a specialist (gynecologist, psychiatrist, or psychologist) for treatment. SEEK IMMEDIATE MEDICAL CARE IF:   You have severe depression.  You have excessive vaginal bleeding.  You fell and think you have a broken bone.  You have pain when you urinate.  You develop leg or chest pain.  You have a fast pounding heart beat (palpitations).  You have severe headaches.  You develop vision problems.  You feel a lump in your breast.  You have abdominal pain or severe indigestion.   This information is not intended to replace advice given to you by your health care provider. Make sure you discuss any questions you have with your health care provider.   Document Released: 12/20/2003 Document Revised: 06/01/2013 Document Reviewed: 04/28/2013 Elsevier Interactive Patient Education 2016 [employer] Inc[employer]  

## 2016-06-23 ENCOUNTER — Ambulatory Visit (INDEPENDENT_AMBULATORY_CARE_PROVIDER_SITE_OTHER): Payer: Federal, State, Local not specified - PPO | Admitting: Gynecology

## 2016-06-23 ENCOUNTER — Encounter: Payer: Self-pay | Admitting: Gynecology

## 2016-06-23 VITALS — BP 118/72 | Wt 173.0 lb

## 2016-06-23 DIAGNOSIS — Z79890 Hormone replacement therapy (postmenopausal): Secondary | ICD-10-CM

## 2016-06-23 DIAGNOSIS — B359 Dermatophytosis, unspecified: Secondary | ICD-10-CM | POA: Diagnosis not present

## 2016-06-23 DIAGNOSIS — R232 Flushing: Secondary | ICD-10-CM

## 2016-06-23 DIAGNOSIS — R102 Pelvic and perineal pain: Secondary | ICD-10-CM

## 2016-06-23 DIAGNOSIS — Z01419 Encounter for gynecological examination (general) (routine) without abnormal findings: Secondary | ICD-10-CM | POA: Insufficient documentation

## 2016-06-23 MED ORDER — NYSTATIN-TRIAMCINOLONE 100000-0.1 UNIT/GM-% EX CREA: TOPICAL_CREAM | Freq: Two times a day (BID) | CUTANEOUS | Status: DC

## 2016-06-23 NOTE — Patient Instructions (Signed)
Nystatin; Triamcinolone cream or ointment What is this medicine? NYSTATIN; TRIAMCINOLONE (nye STAT in; trye am SIN oh lone) is a combination of an antifungal medicine and a steroid. It is used to treat certain kinds of fungal or yeast infections of the skin. This medicine may be used for other purposes; ask your health care provider or pharmacist if you have questions. What should I tell my health care provider before I take this medicine? They need to know if you have any of these conditions: -large areas of burned or damaged skin -skin wasting or thinning -peripheral vascular disease or poor circulation -an unusual or allergic reaction to nystatin, triamcinolone, other corticosteroids, other medicines, foods, dyes, or preservatives -pregnant or trying to get pregnant -breast-feeding How should I use this medicine? This medicine is for external use only. Do not take by mouth. Follow the directions on the prescription label. Wash your hands before and after use. If treating hand or nail infections, wash hands before use only. Apply a thin layer of this medicine to the affected area and rub in gently. Do not use on healthy skin or over large areas of skin. Do not get this medicine in your eyes. If you do, rinse out with plenty of cool tap water. When applying to the groin area, apply a limited amount and do not use for longer than 2 weeks unless directed to by your doctor or health care professional. Do not cover or wrap the treated area with an airtight bandage (such as a plastic bandage). Use the full course of treatment prescribed, even if you think the infection is getting better. Use at regular intervals. Do not use your medicine more often than directed. Do not use this medicine for any condition other than the one for which it was prescribed. Talk to your pediatrician regarding the use of this medicine in children. While this drug may be prescribed for selected conditions, precautions do apply.  Children being treated in the diaper area should not wear tight-fitting diapers or plastic pants. Elderly patients are more likely to have damaged skin through aging, and this may increase side effects. This medicine should only be used for brief periods and infrequently in older patients. Overdosage: If you think you have taken too much of this medicine contact a poison control center or emergency room at once. NOTE: This medicine is only for you. Do not share this medicine with others. What if I miss a dose? If you miss a dose, use it as soon as you can. If it is almost time for your next dose, use only that dose. Do not use double or extra doses. What may interact with this medicine? Interactions are not expected. Do not use any other skin products on the affected area without telling your doctor or health care professional. This list may not describe all possible interactions. Give your health care provider a list of all the medicines, herbs, non-prescription drugs, or dietary supplements you use. Also tell them if you smoke, drink alcohol, or use illegal drugs. Some items may interact with your medicine. What should I watch for while using this medicine? Tell your doctor or health care professional if your symptoms do not start to get better within 1 week when treating the groin area or within 2 weeks when treating the feet. . Tell your doctor or health care professional if you develop sores or blisters that do not heal properly. If your skin infection returns after stopping this medicine, contact your doctor  or health care professional. If you are using this medicine to treat an infection in the groin area, do not wear underwear that is tight-fitting or made from synthetic fibers such as rayon or nylon. Instead, wear loose-fitting, cotton underwear. Also dry the area completely after bathing. What side effects may I notice from receiving this medicine? Side effects that you should report to your  doctor or health care professional as soon as possible: -burning or itching of the skin -dark red spots on the skin -loss of feeling on skin -painful, red, pus-filled blisters in hair follicles -skin infection -thinning of the skin or sunburn: more likely if applied to the face Side effects that usually do not require medical attention (report to your doctor or health care professional if they continue or are bothersome): -dry or peeling skin -skin irritation This list may not describe all possible side effects. Call your doctor for medical advice about side effects. You may report side effects to FDA at 1-800-FDA-1088. Where should I keep my medicine? Keep out of the reach of children. Store at room temperature between 15 and 30 degrees C (59 and 86 degrees F). Do not freeze. Throw away any unused medicine after the expiration date. NOTE: This sheet is a summary. It may not cover all possible information. If you have questions about this medicine, talk to your doctor, pharmacist, or health care provider.    2016, Elsevier/[employer] Standard. (2008-04-21 17:29:26) Body Ringworm Ringworm (tinea corporis) is a fungal infection of the skin on the body. This infection is not caused by worms, but is actually caused by a fungus. Fungus normally lives on the top of your skin and can be useful. However, in the case of ringworms, the fungus grows out of control and causes a skin infection. It can involve any area of skin on the body and can spread easily from one person to another (contagious). Ringworm is a common problem for children, but it can affect adults as well. Ringworm is also often found in athletes, especially wrestlers who share equipment and mats.  CAUSES  Ringworm of the body is caused by a fungus called dermatophyte. It can spread by:  Touchingother people who are infected.  Touchinginfected pets.  Touching or sharingobjects that have been in contact with the infected person or pet  (hats, combs, towels, clothing, sports equipment). SYMPTOMS   Itchy, raised red spots and bumps on the skin.  Ring-shaped rash.  Redness near the border of the rash with a clear center.  Dry and scaly skin on or around the rash. Not every person develops a ring-shaped rash. Some develop only the red, scaly patches. DIAGNOSIS  Most often, ringworm can be diagnosed by performing a skin exam. Your caregiver may choose to take a skin scraping from the affected area. The sample will be examined under the microscope to see if the fungus is present.  TREATMENT  Body ringworm may be treated with a topical antifungal cream or ointment. Sometimes, an antifungal shampoo that can be used on your body is prescribed. You may be prescribed antifungal medicines to take by mouth if your ringworm is severe, keeps coming back, or lasts a long time.  HOME CARE INSTRUCTIONS   Only take over-the-counter or prescription medicines as directed by your caregiver.  Wash the infected area and dry it completely before applying yourcream or ointment.  When using antifungal shampoo to treat the ringworm, leave the shampoo on the body for 3-5 minutes before rinsing.   Wear  loose clothing to stop clothes from rubbing and irritating the rash.  Wash or change your bed sheets every night while you have the rash.  Have your pet treated by your veterinarian if it has the same infection. To prevent ringworm:   Practice good hygiene.  Wear sandals or shoes in public places and showers.  Do not share personal items with others.  Avoid touching red patches of skin on other people.  Avoid touching pets that have bald spots or wash your hands after doing so. SEEK MEDICAL CARE IF:   Your rash continues to spread after 7 days of treatment.  Your rash is not gone in 4 weeks.  The area around your rash becomes red, warm, tender, and swollen.   This information is not intended to replace advice given to you by your  health care provider. Make sure you discuss any questions you have with your health care provider.   Document Released: 09/26/2000 Document Revised: 06/23/2012 Document Reviewed: 04/12/2012 Elsevier Interactive Patient Education [employer]

## 2016-06-23 NOTE — Progress Notes (Addendum)
Tara Bates [birth year] [id number]   History:    65 y.o.  who presented to the office today with several complaints today. Patient stated that since she came back for the beach several days ago she's been complaining of irritation and pruritus on both axilla. Also patient with bouts of constipation and loose stool. She does have history of irritable bowel syndrome. She also is complaining of low abdominal discomfort. She denied any fever, chills, nausea, vomiting or any GU complaints. She is complaining also of vasomotor symptoms and night sweats. Review of her record indicated that she had been on hormone replacement therapy with another provider for about 8 years the past and then most recently for 2 years which she discontinued approximately urine half ago. Review of her record also indicated that her PCP is Dr. Fara Chute who is been doing her blood work. Patient in the past as seen Dr. Patsi Sears urologist because of recurrent urinary tract infections. Patient with prior history transvaginal hysterectomy along with suburethral sling. She states that she did not check a thyroid function tests her CBC or urinalysis. She will be getting her flu vaccine at his office in the next few weeks. Patient denies any blood in her stool. Patient with no previous history of any abnormal Pap smear. She had a normal bone density study in 2016. Her colonoscopy in 2015 benign polyps were removed and she is on a 5 year recall.  Past medical history,surgical history, family history and social history were all reviewed and documented in the EPIC chart.  Gynecologic History No LMP recorded. Patient has had a hysterectomy. Contraception: status post hysterectomy Last Pap: 2012. Results were: normal Last mammogram: 2017. Results were: normal  Obstetric History OB History  Gravida Para Term Preterm AB Living  2 2 2     2   SAB TAB Ectopic Multiple Live Births          2    # Outcome Date GA Lbr Len/2nd Weight  Sex Delivery Anes PTL Lv  2 Term     M Vag-Spont  N LIV  1 Term     F Vag-Spont  N LIV       ROS: A ROS was performed and pertinent positives and negatives are included in the history.  GENERAL: No fevers or chills. HEENT: No change in vision, no earache, sore throat or sinus congestion. NECK: No pain or stiffness. CARDIOVASCULAR: No chest pain or pressure. No palpitations. PULMONARY: No shortness of breath, cough or wheeze. GASTROINTESTINAL: Constipation, bloating and abdominal discomfort GENITOURINARY: No urinary frequency, urgency, hesitancy or dysuria. MUSCULOSKELETAL: No joint or muscle pain, no back pain, no recent trauma. DERMATOLOGIC: Irritation and pruritus both axilla ENDOCRINE: No polyuria, polydipsia, no heat or cold intolerance. No recent change in weight. HEMATOLOGICAL: No anemia or easy bruising or bleeding. NEUROLOGIC: No headache, seizures, numbness, tingling or weakness. PSYCHIATRIC: No depression, no loss of interest in normal activity or change in sleep pattern.     Exam: chaperone present  BP 118/72   There is no height or weight on file to calculate BMI.  General appearance : Well developed well nourished female. No acute distress HEENT: Eyes: no retinal hemorrhage or exudates,  Neck supple, trachea midline, no carotid bruits, no thyroidmegaly Lungs: Clear to auscultation, no rhonchi or wheezes, or rib retractions  Heart: Regular rate and rhythm, no murmurs or gallops Breast:Examined in sitting and supine position were symmetrical in appearance, no palpable masses or tenderness,  no skin  retraction, no nipple inversion, no nipple discharge, no skin discoloration, no axillary or supraclavicular lymphadenopathy Abdomen: no palpable masses or tenderness, no rebound or guarding Extremities: Bilateral axillary rash consistent with ringworm  Pelvic:  Bartholin, Urethra, Skene Glands: Within normal limits             Vagina: No gross lesions or discharge, atrophic  changes  Cervix: Absent  Uterus  absent  Adnexa  Without masses or tenderness  Anus and perineum  normal   Rectovaginal  normal sphincter tone without palpated masses or tenderness             Hemoccult PCP will provide     Assessment/Plan:  65 y.o. femalewith bilateral axillary ringworm will be treated with mytrex cream to apply twice a day for 7 days. She will hold off any ordered during that time. She will avoid any constrictive clothing. As to her vasomotor symptoms we reviewed her past history of hormone replacement therapy. She would be exceeding her 10 year recommended usage if we were to report prescribed it again. We discussed women's health initiative study we discussed risks benefits and pros and cons. She would like to try alternative options and I'm going to recommend Relizen for relief of her hot flashes which is a natural supplement with no estrogen and she is to take 1 tablet twice a day. I've given her the website and the phone number to call for her to order for which she does not need a prescription. Also for those other times when she may have hot flashes she can use peppermint oral to apply transdermally behind her ear 2-3 times a day as needed. Patient also to return back to the office next week for an ultrasound to assess her ovaries because of her symptoms of bloating and change in bowel movement habits although she does have a history of IBS adjust to make sure that her ovaries are  Not an issue. A CBC along with a TSH and urinalysis will be obtained today. Pap smear not indicated. We discussed importance of calcium vitamin D and weightbearing exercises for osteoporosis prevention.  Total time of consultation 25 minutes   Ok EdwardsFERNANDEZ,Shiara Mcgough H MD, 11:08 AM 06/23/2016

## 2016-06-23 NOTE — Addendum Note (Signed)
Addended by: Ok EdwardsFERNANDEZ, JUAN H on: 06/23/2016 02:09 PM   Modules accepted: Orders

## 2016-06-24 ENCOUNTER — Telehealth: Payer: Self-pay | Admitting: *Deleted

## 2016-06-24 LAB — CBC WITH DIFFERENTIAL/PLATELET
BASOS PCT: 0 %
Basophils Absolute: 0 cells/uL (ref 0–200)
EOS PCT: 2 %
Eosinophils Absolute: 104 cells/uL (ref 15–500)
HCT: 34.8 % — ABNORMAL LOW (ref 35.0–45.0)
HEMOGLOBIN: 10.9 g/dL — AB (ref 11.7–15.5)
LYMPHS PCT: 34 %
Lymphs Abs: 1768 cells/uL (ref 850–3900)
MCH: 26.8 pg — AB (ref 27.0–33.0)
MCHC: 31.3 g/dL — AB (ref 32.0–36.0)
MCV: 85.5 fL (ref 80.0–100.0)
MONO ABS: 520 {cells}/uL (ref 200–950)
MPV: 11.1 fL (ref 7.5–12.5)
Monocytes Relative: 10 %
Neutro Abs: 2808 cells/uL (ref 1500–7800)
Neutrophils Relative %: 54 %
Platelets: 350 10*3/uL (ref 140–400)
RBC: 4.07 MIL/uL (ref 3.80–5.10)
RDW: 16 % — ABNORMAL HIGH (ref 11.0–15.0)
WBC: 5.2 10*3/uL (ref 3.8–10.8)

## 2016-06-24 LAB — URINALYSIS W MICROSCOPIC + REFLEX CULTURE
BACTERIA UA: NONE SEEN [HPF]
BILIRUBIN URINE: NEGATIVE
CRYSTALS: NONE SEEN [HPF]
Casts: NONE SEEN [LPF]
GLUCOSE, UA: NEGATIVE
HGB URINE DIPSTICK: NEGATIVE
KETONES UR: NEGATIVE
LEUKOCYTES UA: NEGATIVE
Nitrite: NEGATIVE
PROTEIN: NEGATIVE
RBC / HPF: NONE SEEN RBC/HPF (ref <=2)
SQUAMOUS EPITHELIAL / LPF: NONE SEEN [HPF] (ref <=5)
Specific Gravity, Urine: 1.003 (ref 1.001–1.035)
WBC UA: NONE SEEN WBC/HPF (ref <=5)
Yeast: NONE SEEN [HPF]
pH: 7 (ref 5.0–8.0)

## 2016-06-24 LAB — TSH: TSH: 1.33 mIU/L

## 2016-06-24 MED ORDER — TRIAMCINOLONE ACETONIDE 0.1 % EX CREA: TOPICAL_CREAM | CUTANEOUS | 0 refills | Status: DC

## 2016-06-24 MED ORDER — NYSTATIN 100000 UNIT/GM EX CREA: TOPICAL_CREAM | CUTANEOUS | 0 refills | Status: DC

## 2016-06-24 NOTE — Telephone Encounter (Signed)
Pt called was seen in office yesterday Rx for nystatin-triamcinolone was never sent to pharmacy. I told pt I would sent today, however I will send separate due to the cost of medication is typically expensive. Pt verbalized she understood. Rx sent.

## 2016-07-07 ENCOUNTER — Ambulatory Visit (INDEPENDENT_AMBULATORY_CARE_PROVIDER_SITE_OTHER): Payer: Federal, State, Local not specified - PPO

## 2016-07-07 ENCOUNTER — Encounter: Payer: Self-pay | Admitting: Gynecology

## 2016-07-07 ENCOUNTER — Ambulatory Visit (INDEPENDENT_AMBULATORY_CARE_PROVIDER_SITE_OTHER): Payer: Federal, State, Local not specified - PPO | Admitting: Gynecology

## 2016-07-07 VITALS — BP 120/78

## 2016-07-07 DIAGNOSIS — R102 Pelvic and perineal pain: Secondary | ICD-10-CM | POA: Diagnosis not present

## 2016-07-07 DIAGNOSIS — K589 Irritable bowel syndrome without diarrhea: Secondary | ICD-10-CM

## 2016-07-07 DIAGNOSIS — R1010 Upper abdominal pain, unspecified: Secondary | ICD-10-CM | POA: Diagnosis not present

## 2016-07-07 DIAGNOSIS — Z23 Encounter for immunization: Secondary | ICD-10-CM

## 2016-07-07 NOTE — Addendum Note (Signed)
Addended by: Berna SpareASTILLO, Montell Leopard A on: 07/07/2016 11:40 AM   Modules accepted: Orders

## 2016-07-07 NOTE — Patient Instructions (Signed)

## 2016-07-07 NOTE — Progress Notes (Signed)
   Patient is a 65 year old who presented to the office today to discuss her ultrasounds that she been complaining on and off low abdominal pains. Patient had been seen in the office for the abdominal pains as well as ringworm on her arm early this month.. She does have past history of irritable bowel syndrome.Review of her record also indicated that her PCP is Dr. Fara ChutePaul Sasser who is been doing her blood work. Patient in the past as seen Dr. Patsi Searsannenbaum urologist because of recurrent urinary tract infections. Patient with prior history transvaginal hysterectomy along with suburethral sling.  patient normal colonoscopy 2015. Patient on last office visit had a CBC which had indicated her hemoglobin was 10.9 she's currently on iron supplementation. Her PCP has also been following.  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 Right and left ovary normal. No apparent adnexal masses. No fluid in the cul-de-sac  Assessment/plan: Patient symptoms attributed to her irritable bowel syndrome she was reassured that her ovaries were normal. She received the flu vaccine today. Patient otherwise scheduled to return back to the office in one year or when necessary.

## 2016-12-02 ENCOUNTER — Encounter: Payer: Self-pay | Admitting: Gynecology

## 2016-12-10 DIAGNOSIS — R151 Fecal smearing: Secondary | ICD-10-CM | POA: Insufficient documentation

## 2016-12-10 DIAGNOSIS — N8111 Cystocele, midline: Secondary | ICD-10-CM | POA: Insufficient documentation

## 2016-12-10 DIAGNOSIS — N816 Rectocele: Secondary | ICD-10-CM | POA: Insufficient documentation

## 2017-02-25 ENCOUNTER — Encounter: Payer: Self-pay | Admitting: Gynecology

## 2017-02-27 ENCOUNTER — Encounter: Payer: Federal, State, Local not specified - PPO | Admitting: Gynecology

## 2017-03-04 ENCOUNTER — Encounter: Payer: Federal, State, Local not specified - PPO | Admitting: Gynecology

## 2017-03-18 ENCOUNTER — Ambulatory Visit (INDEPENDENT_AMBULATORY_CARE_PROVIDER_SITE_OTHER): Payer: Federal, State, Local not specified - PPO | Admitting: Gynecology

## 2017-03-18 ENCOUNTER — Encounter: Payer: Self-pay | Admitting: Gynecology

## 2017-03-18 VITALS — BP 122/80 | Ht 66.0 in | Wt 173.8 lb

## 2017-03-18 DIAGNOSIS — Z79890 Hormone replacement therapy (postmenopausal): Secondary | ICD-10-CM | POA: Diagnosis not present

## 2017-03-18 DIAGNOSIS — Z780 Asymptomatic menopausal state: Secondary | ICD-10-CM | POA: Diagnosis not present

## 2017-03-18 DIAGNOSIS — Z01419 Encounter for gynecological examination (general) (routine) without abnormal findings: Secondary | ICD-10-CM

## 2017-03-18 DIAGNOSIS — N952 Postmenopausal atrophic vaginitis: Secondary | ICD-10-CM | POA: Diagnosis not present

## 2017-03-18 MED ORDER — ESTRADIOL 10 MCG VA TABS: 1.0000 | ORAL_TABLET | VAGINAL | 11 refills | Status: DC

## 2017-03-18 NOTE — Patient Instructions (Addendum)
Bone Densitometry Bone densitometry is an imaging test that uses a special X-ray to measure the amount of calcium and other minerals in your bones (bone density). This test is also known as a bone mineral density test or dual-energy X-ray absorptiometry (DXA). The test can measure bone density at your hip and your spine. It is similar to having a regular X-ray. You may have this test to:  Diagnose a condition that causes weak or thin bones (osteoporosis).  Predict your risk of a broken bone (fracture).  Determine how well osteoporosis treatment is working.  Tell a health care provider about:  Any allergies you have.  All medicines you are taking, including vitamins, herbs, eye drops, creams, and over-the-counter medicines.  Any problems you or family members have had with anesthetic medicines.  Any blood disorders you have.  Any surgeries you have had.  Any medical conditions you have.  Possibility of pregnancy.  Any other medical test you had within the previous 14 days that used contrast material. What are the risks? Generally, this is a safe procedure. However, problems can occur and may include the following:  This test exposes you to a very small amount of radiation.  The risks of radiation exposure may be greater to unborn children.  What happens before the procedure?  Do not take any calcium supplements for 24 hours before having the test. You can otherwise eat and drink what you usually do.  Take off all metal jewelry, eyeglasses, dental appliances, and any other metal objects. What happens during the procedure?  You may lie on an exam table. There will be an X-ray generator below you and an imaging device above you.  Other devices, such as boxes or braces, may be used to position your body properly for the scan.  You will need to lie still while the machine slowly scans your body.  The images will show up on a computer monitor. What happens after the  procedure? You may need more testing at a later time. This information is not intended to replace advice given to you by your health care provider. Make sure you discuss any questions you have with your health care provider. Document Released: 10/21/2004 Document Revised: 03/06/2016 Document Reviewed: 03/09/2014 Elsevier Interactive Patient Education  2018 [employer].  Estradiol vaginal tablets What is this medicine? ESTRADIOL (es tra DYE ole) vaginal tablet is used to help relieve symptoms of vaginal irritation and dryness that occurs in some women during menopause. This medicine may be used for other purposes; ask your health care provider or pharmacist if you have questions. COMMON BRAND NAME(S): Vagifem, Yuvafem What should I tell my health care provider before I take this medicine? They need to know if you have any of these conditions: -abnormal vaginal bleeding -blood vessel disease or blood clots -breast, cervical, endometrial, ovarian, liver, or uterine cancer -dementia -diabetes -gallbladder disease -heart disease or recent heart attack -high blood pressure -high cholesterol -high level of calcium in the blood -hysterectomy -kidney disease -liver disease -migraine headaches -protein C deficiency -protein S deficiency -stroke -systemic lupus erythematosus (SLE) -tobacco smoker -an unusual or allergic reaction to estrogens, other hormones, medicines, foods, dyes, or preservatives -pregnant or trying to get pregnant -breast-feeding How should I use this medicine? This medicine is only for use in the vagina. Do not take by mouth. Wash and dry your hands before and after use. Read package directions carefully. Unwrap the applicator package. Be sure to use a new applicator for each dose.  Use at the same time each day. If the tablet has fallen out of the applicator, but is still in the package, carefully place it back into the applicator. If the tablet has fallen out of the  package, that applicator should be thrown out and you should use a new applicator containing a new tablet. Lie on your back, part and bend your knees. Gently insert the applicator as far as comfortably possible into the vagina. Then, gently press the plunger until the plunger is fully depressed. This will release the tablet into the vagina. Gently remove the applicator. Throw away the applicator after use. Do not use your medicine more often than directed. Do not stop using except on the advice of your doctor or health care professional. Talk to your pediatrician regarding the use of this medicine in children. This medicine is not approved for use in children. A patient package insert for the product will be given with each prescription and refill. Read this sheet carefully each time. The sheet may change frequently. Overdosage: If you think you have taken too much of this medicine contact a poison control center or emergency room at once. NOTE: This medicine is only for you. Do not share this medicine with others. What if I miss a dose? If you miss a dose, take it as soon as you can. If it is almost time for your next dose, take only that dose. Do not take double or extra doses. What may interact with this medicine? Do not take this medicine with any of the following medications: -aromatase inhibitors like aminoglutethimide, anastrozole, exemestane, letrozole, testolactone This medicine may also interact with the following medications: -antibiotics used to treat tuberculosis like rifabutin, rifampin and rifapentene -raloxifene or tamoxifen -warfarin This list may not describe all possible interactions. Give your health care provider a list of all the medicines, herbs, non-prescription drugs, or dietary supplements you use. Also tell them if you smoke, drink alcohol, or use illegal drugs. Some items may interact with your medicine. What should I watch for while using this medicine? Visit your health  care professional for regular checks on your progress. You will need a regular breast and pelvic exam. You should also discuss the need for regular mammograms with your health care professional, and follow his or her guidelines. This medicine can make your body retain fluid, making your fingers, hands, or ankles swell. Your blood pressure can go up. Contact your doctor or health care professional if you feel you are retaining fluid. If you have any reason to think you are pregnant; stop taking this medicine at once and contact your doctor or health care professional. Tobacco smoking increases the risk of getting a blood clot or having a stroke, especially if you are more than 64 years old. You are strongly advised not to smoke. If you wear contact lenses and notice visual changes, or if the lenses begin to feel uncomfortable, consult your eye care specialist. If you are going to have elective surgery, you may need to stop taking this medicine beforehand. Consult your health care professional for advice prior to scheduling the surgery. What side effects may I notice from receiving this medicine? Side effects that you should report to your doctor or health care professional as soon as possible: -allergic reactions like skin rash, itching or hives, swelling of the face, lips, or tongue -breast tissue changes or discharge -changes in vision -chest pain -confusion, trouble speaking or understanding -dark urine -general ill feeling or flu-like symptoms -  light-colored stools -nausea, vomiting -pain, swelling, warmth in the leg -right upper belly pain -severe headaches -shortness of breath -sudden numbness or weakness of the face, arm or leg -trouble walking, dizziness, loss of balance or coordination -unusual vaginal bleeding -yellowing of the eyes or skin Side effects that usually do not require medical attention (report to your doctor or health care professional if they continue or are  bothersome): -hair loss -increased hunger or thirst -increased urination -symptoms of vaginal infection like itching, irritation or unusual discharge -unusually weak or tired This list may not describe all possible side effects. Call your doctor for medical advice about side effects. You may report side effects to FDA at 1-800-FDA-1088. Where should I keep my medicine? Keep out of the reach of children. Store at room temperature between 15 and 30 degrees C (59 and 86 degrees F). Throw away any unused medicine after the expiration date. NOTE: This sheet is a summary. It may not cover all possible information. If you have questions about this medicine, talk to your doctor, pharmacist, or health care provider.  2018 Elsevier/Gold Standard (2014-09-13 09:22:51)

## 2017-03-18 NOTE — Progress Notes (Signed)
Tara Bates 1951/07/06 [id number]   History:    66 y.o.  for annual gyn exam with no complaints today. Patient has informing her that her brother who is 2 years older than her was diagnosed with colon cancer. Patient herself had benign colon polyps removed in the past. Her last colonoscopy was in 2015 she is on a 5 year recall. Patient is no longer on any hormone replacement therapy but is complaining some vaginal dryness and discomfort during intercourse. Patient has had a transvaginal hysterectomy along with a suburethral sling in the past. Patient had a normal bone density study in 2016. Her PCP Dr. Fara Chute has been doing her blood work and her vaccines are up-to-date   Past medical history,surgical history, family history and social history were all reviewed and documented in the EPIC chart.  Gynecologic History No LMP recorded. Patient has had a hysterectomy. Contraception: status post hysterectomy Last Pap: 2012. Results were: normal Last mammogram:2018 were: normal  Obstetric History OB History  Gravida Para Term Preterm AB Living  2 2 2     2   SAB TAB Ectopic Multiple Live Births          2    # Outcome Date GA Lbr Len/2nd Weight Sex Delivery Anes PTL Lv  2 Term     M Vag-Spont  N LIV  1 Term     F Vag-Spont  N LIV       ROS: A ROS was performed and pertinent positives and negatives are included in the history.  GENERAL: No fevers or chills. HEENT: No change in vision, no earache, sore throat or sinus congestion. NECK: No pain or stiffness. CARDIOVASCULAR: No chest pain or pressure. No palpitations. PULMONARY: No shortness of breath, cough or wheeze. GASTROINTESTINAL: No abdominal pain, nausea, vomiting or diarrhea, melena or bright red blood per rectum. GENITOURINARY: No urinary frequency, urgency, hesitancy or dysuria. MUSCULOSKELETAL: No joint or muscle pain, no back pain, no recent trauma. DERMATOLOGIC: No rash, no itching, no lesions. ENDOCRINE: No polyuria,  polydipsia, no heat or cold intolerance. No recent change in weight. HEMATOLOGICAL: No anemia or easy bruising or bleeding. NEUROLOGIC: No headache, seizures, numbness, tingling or weakness. PSYCHIATRIC: No depression, no loss of interest in normal activity or change in sleep pattern.     Exam: chaperone present  BP 122/80   Ht 5\' 6"  (1.676 m)   Wt 173 lb 12.8 oz (78.8 kg)   BMI 28.05 kg/m   Body mass index is 28.05 kg/m.  General appearance : Well developed well nourished female. No acute distress HEENT: Eyes: no retinal hemorrhage or exudates,  Neck supple, trachea midline, no carotid bruits, no thyroidmegaly Lungs: Clear to auscultation, no rhonchi or wheezes, or rib retractions  Heart: Regular rate and rhythm, no murmurs or gallops Breast:Examined in sitting and supine position were symmetrical in appearance, no palpable masses or tenderness,  no skin retraction, no nipple inversion, no nipple discharge, no skin discoloration, no axillary or supraclavicular lymphadenopathy Abdomen: no palpable masses or tenderness, no rebound or guarding Extremities: no edema or skin discoloration or tenderness  Pelvic:  Bartholin, Urethra, Skene Glands: Within normal limits             Vagina: Atrophic changes  Cervix: Absent             Uterus absent              Adnexa  Without masses or tenderness  Anus and perineum  normal   Rectovaginal  normal sphincter tone without palpated masses or tenderness             HemoccultCards provided     Assessment/Plan:  66 y.o. female for annual exam will make an appointment for the month of July for her bone density study. We discussed importance of calcium vitamin D and weightbearing exercises for osteoporosis prevention. For her vaginal atrophy we discussed Vagifem 10 g to apply intravaginally twice a week risk benefits and pros and cons were discussed and literature information was provided. Pap smear no longer indicated according to the new  guidelines. Fecal Hemoccult cards provided patient to submit to the office for testing.  An additional 10 minutes was spent discussing hormonal replacement therapy, vaginal atrophy

## 2017-04-29 ENCOUNTER — Other Ambulatory Visit: Payer: Self-pay | Admitting: Anesthesiology

## 2017-04-29 DIAGNOSIS — Z1211 Encounter for screening for malignant neoplasm of colon: Secondary | ICD-10-CM

## 2017-05-05 ENCOUNTER — Other Ambulatory Visit: Payer: Self-pay | Admitting: Gynecology

## 2017-05-05 ENCOUNTER — Ambulatory Visit (INDEPENDENT_AMBULATORY_CARE_PROVIDER_SITE_OTHER): Payer: Federal, State, Local not specified - PPO

## 2017-05-05 DIAGNOSIS — Z780 Asymptomatic menopausal state: Secondary | ICD-10-CM | POA: Diagnosis not present

## 2017-05-05 DIAGNOSIS — Z13820 Encounter for screening for osteoporosis: Secondary | ICD-10-CM

## 2017-05-07 ENCOUNTER — Other Ambulatory Visit: Payer: Self-pay | Admitting: *Deleted

## 2017-05-07 ENCOUNTER — Encounter: Payer: Self-pay | Admitting: *Deleted

## 2017-05-07 DIAGNOSIS — Z1321 Encounter for screening for nutritional disorder: Secondary | ICD-10-CM

## 2017-05-21 NOTE — Telephone Encounter (Signed)
I received a reminder pt never read my chart message I called and informed pt of message.

## 2018-03-19 ENCOUNTER — Encounter: Payer: Federal, State, Local not specified - PPO | Admitting: Obstetrics & Gynecology

## 2018-04-07 ENCOUNTER — Ambulatory Visit: Payer: Federal, State, Local not specified - PPO | Admitting: Obstetrics & Gynecology

## 2018-04-07 ENCOUNTER — Encounter: Payer: Self-pay | Admitting: Obstetrics & Gynecology

## 2018-04-07 VITALS — BP 128/76 | Ht 65.75 in | Wt 175.0 lb

## 2018-04-07 DIAGNOSIS — Z90710 Acquired absence of both cervix and uterus: Secondary | ICD-10-CM | POA: Diagnosis not present

## 2018-04-07 DIAGNOSIS — Z780 Asymptomatic menopausal state: Secondary | ICD-10-CM | POA: Diagnosis not present

## 2018-04-07 DIAGNOSIS — Z01419 Encounter for gynecological examination (general) (routine) without abnormal findings: Secondary | ICD-10-CM

## 2018-04-07 NOTE — Progress Notes (Signed)
Tara LothMyra E Bates 06/29/1951 [id number]   History:    67 y.o. G2P2L2 Married  RP:  Established patient presenting for annual gyn exam   HPI: S/P Total Hysterectomy.  Menopause, well on no HRT.  Decided not to start on the Estradiol cream last year.  Rarely sexually active.  Had 2 Sling procedures, last with Dr Marcello Fennelannebaum.  No SUI.  BMs wnl.  Breasts wnl.  BMI 28.46.  Will do Health labs with Fam MD this year.  Past medical history,surgical history, family history and social history were all reviewed and documented in the EPIC chart.  Gynecologic History No LMP recorded. Patient has had a hysterectomy. Contraception: status post hysterectomy Last Pap: 12/2010. Results were: Negative Last mammogram: 11/2017. Results were: normal Bone Density: 04/2017 Normal Colonoscopy: 2015, 5 yr schedule.  Will repeat in 2020.  Obstetric History OB History  Gravida Para Term Preterm AB Living  2 2 2     2   SAB TAB Ectopic Multiple Live Births          2    # Outcome Date GA Lbr Len/2nd Weight Sex Delivery Anes PTL Lv  2 Term     M Vag-Spont  N LIV  1 Term     F Vag-Spont  N LIV     ROS: A ROS was performed and pertinent positives and negatives are included in the history.  GENERAL: No fevers or chills. HEENT: No change in vision, no earache, sore throat or sinus congestion. NECK: No pain or stiffness. CARDIOVASCULAR: No chest pain or pressure. No palpitations. PULMONARY: No shortness of breath, cough or wheeze. GASTROINTESTINAL: No abdominal pain, nausea, vomiting or diarrhea, melena or bright red blood per rectum. GENITOURINARY: No urinary frequency, urgency, hesitancy or dysuria. MUSCULOSKELETAL: No joint or muscle pain, no back pain, no recent trauma. DERMATOLOGIC: No rash, no itching, no lesions. ENDOCRINE: No polyuria, polydipsia, no heat or cold intolerance. No recent change in weight. HEMATOLOGICAL: No anemia or easy bruising or bleeding. NEUROLOGIC: No headache, seizures, numbness, tingling or  weakness. PSYCHIATRIC: No depression, no loss of interest in normal activity or change in sleep pattern.     Exam:   BP 128/76   Ht 5' 5.75" (1.67 m)   Wt 175 lb (79.4 kg)   BMI 28.46 kg/m   Body mass index is 28.46 kg/m.  General appearance : Well developed well nourished female. No acute distress HEENT: Eyes: no retinal hemorrhage or exudates,  Neck supple, trachea midline, no carotid bruits, no thyroidmegaly Lungs: Clear to auscultation, no rhonchi or wheezes, or rib retractions  Heart: Regular rate and rhythm, no murmurs or gallops Breast:Examined in sitting and supine position were symmetrical in appearance, no palpable masses or tenderness,  no skin retraction, no nipple inversion, no nipple discharge, no skin discoloration, no axillary or supraclavicular lymphadenopathy Abdomen: no palpable masses or tenderness, no rebound or guarding Extremities: no edema or skin discoloration or tenderness  Pelvic: Vulva: Normal             Vagina: No gross lesions or discharge  Cervix/Uterus absent  Adnexa  Without masses or tenderness  Anus: Normal   Assessment/Plan:  67 y.o. female for annual exam   1. Well female exam with routine gynecological exam Gynecologic exam status post total hysterectomy and menopause.  No further Pap test necessary.  Breast exam normal.  Screening mammogram was negative in February 2019.  Will schedule next colonoscopy in 2020.  Health labs with family physician.  2. H/O total hysterectomy  3. Menopause present Well on no hormone replacement therapy.  Last bone density was normal in July 2018.  Vitamin D supplements, calcium rich nutrition and regular weightbearing physical activity recommended.  Genia Del MD, 2:09 PM 04/07/2018

## 2018-04-11 ENCOUNTER — Encounter: Payer: Self-pay | Admitting: Obstetrics & Gynecology

## 2018-04-11 NOTE — Patient Instructions (Addendum)
1. Well female exam with routine gynecological exam Gynecologic exam status post total hysterectomy and menopause.  No further Pap test necessary.  Breast exam normal.  Screening mammogram was negative in February 2019.  Will schedule next colonoscopy in 2020.  Health labs with family physician.  2. H/O total hysterectomy  3. Menopause present Well on no hormone replacement therapy.  Last bone density was normal in July 2018.  Vitamin D supplements, calcium rich nutrition and regular weightbearing physical activity recommended.  Gianelle, it was a pleasure meeting you today!

## 2018-04-27 ENCOUNTER — Encounter: Payer: Self-pay | Admitting: Obstetrics & Gynecology

## 2018-12-10 ENCOUNTER — Encounter: Payer: Self-pay | Admitting: Obstetrics & Gynecology

## 2019-04-29 ENCOUNTER — Encounter: Payer: Federal, State, Local not specified - PPO | Admitting: Obstetrics & Gynecology

## 2019-05-02 ENCOUNTER — Encounter: Payer: Federal, State, Local not specified - PPO | Admitting: Obstetrics & Gynecology

## 2019-05-04 ENCOUNTER — Other Ambulatory Visit: Payer: Self-pay

## 2019-05-05 ENCOUNTER — Encounter: Payer: Self-pay | Admitting: Obstetrics & Gynecology

## 2019-05-05 ENCOUNTER — Ambulatory Visit (INDEPENDENT_AMBULATORY_CARE_PROVIDER_SITE_OTHER): Payer: Federal, State, Local not specified - PPO | Admitting: Obstetrics & Gynecology

## 2019-05-05 VITALS — BP 120/80 | Ht 65.5 in | Wt 174.0 lb

## 2019-05-05 DIAGNOSIS — Z780 Asymptomatic menopausal state: Secondary | ICD-10-CM

## 2019-05-05 DIAGNOSIS — E663 Overweight: Secondary | ICD-10-CM | POA: Diagnosis not present

## 2019-05-05 DIAGNOSIS — Z1211 Encounter for screening for malignant neoplasm of colon: Secondary | ICD-10-CM

## 2019-05-05 DIAGNOSIS — Z01419 Encounter for gynecological examination (general) (routine) without abnormal findings: Secondary | ICD-10-CM | POA: Diagnosis not present

## 2019-05-05 DIAGNOSIS — Z90710 Acquired absence of both cervix and uterus: Secondary | ICD-10-CM | POA: Diagnosis not present

## 2019-05-05 NOTE — Progress Notes (Signed)
Tara Bates [birth year] [id number]   History:    68 y.o. G2P2L2 Married.  4 grand-children.  RP:  Established patient presenting for annual gyn exam   HPI:  S/P Total Hysterectomy.  Postmenopause, well on no HRT.  No pelvic pain.  Rarely sexually active.  Urine/BMs normal.  Breasts normal.  BMI 28.51.  Walking regularly.  Health labs with Fam MD.  Past medical history,surgical history, family history and social history were all reviewed and documented in the EPIC chart.  Gynecologic History No LMP recorded. Patient has had a hysterectomy. Contraception: status post hysterectomy Last Pap: 12/2010. Results were: Negative Last mammogram: 11/2018. Results were: Negative Bone Density: 04/2017 Normal Colonoscopy: 10/2018  Obstetric History OB History  Gravida Para Term Preterm AB Living  2 2 2     2   SAB TAB Ectopic Multiple Live Births          2    # Outcome Date GA Lbr Len/2nd Weight Sex Delivery Anes PTL Lv  2 Term     M Vag-Spont  N LIV  1 Term     F Vag-Spont  N LIV     ROS: A ROS was performed and pertinent positives and negatives are included in the history.  GENERAL: No fevers or chills. HEENT: No change in vision, no earache, sore throat or sinus congestion. NECK: No pain or stiffness. CARDIOVASCULAR: No chest pain or pressure. No palpitations. PULMONARY: No shortness of breath, cough or wheeze. GASTROINTESTINAL: No abdominal pain, nausea, vomiting or diarrhea, melena or bright red blood per rectum. GENITOURINARY: No urinary frequency, urgency, hesitancy or dysuria. MUSCULOSKELETAL: No joint or muscle pain, no back pain, no recent trauma. DERMATOLOGIC: No rash, no itching, no lesions. ENDOCRINE: No polyuria, polydipsia, no heat or cold intolerance. No recent change in weight. HEMATOLOGICAL: No anemia or easy bruising or bleeding. NEUROLOGIC: No headache, seizures, numbness, tingling or weakness. PSYCHIATRIC: No depression, no loss of interest in normal activity or change in  sleep pattern.     Exam:   BP 120/80    Ht 5' 5.5" (1.664 m)    Wt 174 lb (78.9 kg)    BMI 28.51 kg/m   Body mass index is 28.51 kg/m.  General appearance : Well developed well nourished female. No acute distress HEENT: Eyes: no retinal hemorrhage or exudates,  Neck supple, trachea midline, no carotid bruits, no thyroidmegaly Lungs: Clear to auscultation, no rhonchi or wheezes, or rib retractions  Heart: Regular rate and rhythm, no murmurs or gallops Breast:Examined in sitting and supine position were symmetrical in appearance, no palpable masses or tenderness,  no skin retraction, no nipple inversion, no nipple discharge, no skin discoloration, no axillary or supraclavicular lymphadenopathy Abdomen: no palpable masses or tenderness, no rebound or guarding Extremities: no edema or skin discoloration or tenderness  Pelvic: Vulva: Normal             Vagina: No gross lesions or discharge.  Pap reflex done.  Cervix/Uterus absent  Adnexa  Without masses or tenderness  Anus: Normal   Assessment/Plan:  68 y.o. female for annual exam   1. Encounter for Papanicolaou smear of vagina as part of routine gynecological examination Gynecologic exam status post total hysterectomy and menopause.  Pap reflex done on the vaginal vault.  Breast exam normal.  Last screening mammogram February 2020 was negative.  Colonoscopy January 2020.  Health labs with family physician.  2. S/P total hysterectomy  3. Postmenopause Well on no hormone replacement therapy.  No postmenopausal bleeding.  Vitamin D supplements, calcium intake of 1200 mg daily.  Regular weightbearing physical activity is recommended.  Bone density normal in July 2018.  Will repeat bone density at 5 years.  4. Overweight (BMI 25.0-29.9) Recommend a slightly lower calorie/carb diet such as [employer] diet.  Aerobic physical activities 5 times a week and weightlifting every 2 days.  [name] MD, 10:52 AM 05/05/2019

## 2019-05-06 LAB — PAP IG W/ RFLX HPV ASCU

## 2019-05-15 ENCOUNTER — Encounter: Payer: Self-pay | Admitting: Obstetrics & Gynecology

## 2019-05-15 NOTE — Patient Instructions (Signed)
1. Encounter for Papanicolaou smear of vagina as part of routine gynecological examination Gynecologic exam status post total hysterectomy and menopause.  Pap reflex done on the vaginal vault.  Breast exam normal.  Last screening mammogram February 2020 was negative.  Colonoscopy January 2020.  Health labs with family physician.  2. S/P total hysterectomy  3. Postmenopause Well on no hormone replacement therapy.  No postmenopausal bleeding.  Vitamin D supplements, calcium intake of 1200 mg daily.  Regular weightbearing physical activity is recommended.  Bone density normal in July 2018.  Will repeat bone density at 5 years.  4. Overweight (BMI 25.0-29.9) Recommend a slightly lower calorie/carb diet such as [employer].  Aerobic physical activities 5 times a week and weightlifting every 2 days.  Minnie, it was a pleasure seeing you today!  I will inform you of your results as soon as they are available.

## 2019-10-10 ENCOUNTER — Other Ambulatory Visit: Payer: Self-pay | Admitting: Chiropractic Medicine

## 2019-10-10 DIAGNOSIS — G8929 Other chronic pain: Secondary | ICD-10-CM

## 2019-10-10 DIAGNOSIS — M25561 Pain in right knee: Secondary | ICD-10-CM

## 2019-10-11 ENCOUNTER — Ambulatory Visit
Admission: RE | Admit: 2019-10-11 | Discharge: 2019-10-11 | Disposition: A | Discharge status: Home / Self Care | Payer: Federal, State, Local not specified - PPO | Source: Ambulatory Visit | Attending: Chiropractic Medicine | Admitting: Chiropractic Medicine

## 2019-10-11 DIAGNOSIS — G8929 Other chronic pain: Secondary | ICD-10-CM

## 2019-10-20 DIAGNOSIS — M7121 Synovial cyst of popliteal space [Baker], right knee: Secondary | ICD-10-CM | POA: Insufficient documentation

## 2019-10-21 ENCOUNTER — Other Ambulatory Visit: Payer: Federal, State, Local not specified - PPO

## 2019-10-24 ENCOUNTER — Ambulatory Visit: Payer: Federal, State, Local not specified - PPO | Admitting: Obstetrics & Gynecology

## 2019-10-31 ENCOUNTER — Ambulatory Visit: Payer: Federal, State, Local not specified - PPO | Admitting: Obstetrics & Gynecology

## 2019-11-07 ENCOUNTER — Ambulatory Visit: Payer: Federal, State, Local not specified - PPO | Attending: Internal Medicine

## 2019-11-07 DIAGNOSIS — Z23 Encounter for immunization: Secondary | ICD-10-CM | POA: Insufficient documentation

## 2019-11-07 NOTE — Progress Notes (Signed)
   Covid-19 Vaccination Clinic  Name:  PRAGYA LOFASO    MRN: [id number] DOB: December 02, 1950  11/07/2019  Ms. Lagan was observed post Covid-19 immunization for 15 minutes without incidence. She was provided with Vaccine Information Sheet and instruction to access the V-Safe system.   Ms. Duddy was instructed to call 911 with any severe reactions post vaccine: Marland Kitchen Difficulty breathing  . Swelling of your face and throat  . A fast heartbeat  . A bad rash all over your body  . Dizziness and weakness    Immunizations Administered    Name Date Dose VIS Date Route   Pfizer COVID-19 Vaccine 11/07/2019  9:22 AM 0.3 mL 09/23/2019 Intramuscular   Manufacturer: [employer], [employer]   Lot: [id number]   NDC: [address]-5

## 2019-11-28 ENCOUNTER — Ambulatory Visit: Payer: Federal, State, Local not specified - PPO | Attending: Internal Medicine

## 2019-11-28 DIAGNOSIS — Z23 Encounter for immunization: Secondary | ICD-10-CM | POA: Insufficient documentation

## 2019-11-28 NOTE — Progress Notes (Signed)
   Covid-19 Vaccination Clinic  Name:  Tara Bates    MRN: [id number] DOB: [birth year]  11/28/2019  Ms. Tara Bates was observed post Covid-19 immunization for 15 minutes without incidence. She was provided with Vaccine Information Sheet and instruction to access the V-Safe system.   Ms. Tara Bates was instructed to call 911 with any severe reactions post vaccine: Marland Kitchen Difficulty breathing  . Swelling of your face and throat  . A fast heartbeat  . A bad rash all over your body  . Dizziness and weakness    Immunizations Administered    Name Date Dose VIS Date Route   Pfizer COVID-19 Vaccine 11/28/2019  9:46 AM 0.3 mL 09/23/2019 Intramuscular   Manufacturer: [employer], [employer]   Lot: [id number]   NDC: [address]-0

## 2019-12-12 NOTE — H&P (Signed)
TOTAL KNEE ADMISSION H&P  Patient is being admitted for right total knee arthroplasty.  Subjective:  Chief Complaint:right knee pain.  HPI: Tara Bates, 69 y.o. female, has a history of pain and functional disability in the right knee due to arthritis and has failed non-surgical conservative treatments for greater than 12 weeks to includecorticosteriod injections and activity modification.  Onset of symptoms was gradual, starting 2 years ago with gradually worsening course since that time. The patient noted no past surgery on the right knee(s).  Patient currently rates pain in the right knee(s) at 8 out of 10 with activity. Patient has worsening of pain with activity and weight bearing and pain that interferes with activities of daily living.  Patient has evidence of joint space narrowing by imaging studies.. There is no active infection.  Patient Active Problem List   Diagnosis Date Noted  . Encounter for routine gynecological examination 06/23/2016  . Frequent bowel movements 07/01/2012  . Menopause 01/08/2012  . Cystocele 11/24/2011  . Colon polyps    Past Medical History:  Diagnosis Date  . Anemia    iron deficiency anemia   . Arthritis   . Colon polyps   . IBS (irritable bowel syndrome)   . Iron deficiency   . Memory loss   . PONV (postoperative nausea and vomiting)   . Urinary incontinence   . Urinary tract infection    hx of     Past Surgical History:  Procedure Laterality Date  . BACK SURGERY     LOWER BACK  . BLADDER SURGERY    . CHOLECYSTECTOMY  10/28/2012   Procedure: LAPAROSCOPIC CHOLECYSTECTOMY;  Surgeon: Harl Bowie, MD;  Location: [facility];  Service: General;  Laterality: N/A;  . KNEE ARTHROSCOPY     left  . LAPAROSCOPIC CHOLECYSTECTOMY    . REPLACEMENT TOTAL KNEE     LEFT  . TOE SURGERY  1994   left small toe  . VAGINAL HYSTERECTOMY     with sling, still has ovaries     No current facility-administered medications for this encounter.    Current Outpatient Medications  Medication Sig Dispense Refill Last Dose  . Calcium Carbonate-Vit D-Min (CALCIUM 1200 PO) Take by mouth.     . Fish Oil-Cholecalciferol (FISH OIL + D3 PO) Take by mouth.     . Multiple Vitamin (MULTIVITAMIN WITH MINERALS) TABS Take 1 tablet by mouth daily.      Allergies  Allergen Reactions  . Codeine Nausea And Vomiting  . Macrobid [Nitrofurantoin Macrocrystal]   . Morphine And Related Nausea And Vomiting    Social History   Tobacco Use  . Smoking status: Never Smoker  . Smokeless tobacco: Never Used  Substance Use Topics  . Alcohol use: No    Alcohol/week: 0.0 standard drinks    Family History  Problem Relation Age of Onset  . Cancer Mother        LUNG  . Cancer Father        MESOTHELIOMA  . Cancer Sister        MELANOMA     Review of Systems  Constitutional: Negative for chills and fever.  Respiratory: Negative for cough and shortness of breath.   Cardiovascular: Negative for chest pain.  Gastrointestinal: Negative for nausea and vomiting.  Musculoskeletal: Positive for arthralgias.    Objective:  Physical Exam Patient is a 69 year old female.  Well nourished and well developed. General: Alert and oriented x3, cooperative and pleasant, no acute distress. Head: normocephalic, atraumatic, neck  supple. Eyes: EOMI. Respiratory: breath sounds clear in all fields, no wheezing, rales, or rhonchi. Cardiovascular: Regular rate and rhythm, no murmurs, gallops or rubs. Abdomen: non-tender to palpation and soft, normoactive bowel sounds.  Musculoskeletal: Right Knee Exam: No effusion present. No swelling present. The range of motion is 0 to 125 degrees. Moderate crepitus on range of motion of the knee. No medial joint line tenderness. No lateral joint line tenderness. The knee is stable.  Calves soft and nontender. Motor function intact in LE. Strength 5/5 LE bilaterally. Neuro: Distal pulses 2+. Sensation to light touch intact  in LE.  Vital signs in last 24 hours: Labs:   Estimated body mass index is 28.51 kg/m as calculated from the following:   Height as of 05/05/19: 5' 5.5" (1.664 m).   Weight as of 05/05/19: 78.9 kg.   Imaging Review Plain radiographs demonstrate severe degenerative joint disease of the right knee(s). The overall alignment isneutral. The bone quality appears to be adequate for age and reported activity level.  Assessment/Plan:  End stage arthritis, right knee   The patient history, physical examination, clinical judgment of the provider and imaging studies are consistent with end stage degenerative joint disease of the right knee(s) and total knee arthroplasty is deemed medically necessary. The treatment options including medical management, injection therapy arthroscopy and arthroplasty were discussed at length. The risks and benefits of total knee arthroplasty were presented and reviewed. The risks due to aseptic loosening, infection, stiffness, patella tracking problems, thromboembolic complications and other imponderables were discussed. The patient acknowledged the explanation, agreed to proceed with the plan and consent was signed. Patient is being admitted for inpatient treatment for surgery, pain control, PT, OT, prophylactic antibiotics, VTE prophylaxis, progressive ambulation and ADL's and discharge planning. The patient is planning to be discharged home.  Therapy Plans: outpatient therapy at [facility] in [place] Disposition: Home with husband Planned DVT Prophylaxis: aspirin 325mg  BID DME needed: none PCP: Dr. , clearance received  TXA: IV Allergies: codeine - nausea, morphine - nausea Anesthesia Concerns: nausea BMI: 28.6 Not diabetic.  Other: Prefers to stay overnight. Tolerates Oxycodone.  Patient's anticipated LOS is less than 2 midnights, meeting these requirements: - Lives within 1 hour of care - Has a competent adult at home to recover with post-op recover - NO  history of  - Chronic pain requiring opiods  - Diabetes  - Coronary Artery Disease  - Heart failure  - Heart attack  - Stroke  - DVT/VTE  - Cardiac arrhythmia  - Respiratory Failure/COPD  - Renal failure  - Anemia  - Advanced Liver disease  - Patient was instructed on what medications to stop prior to surgery. - Follow-up visit in 2 weeks with Dr. Fara Chute - Begin physical therapy following surgery - Pre-operative lab work as pre-surgical testing - Prescriptions will be provided in hospital at time of discharge  Lequita Halt, PA-C Orthopedic Surgery [facility] [place] [phone]

## 2019-12-16 NOTE — Patient Instructions (Addendum)
DUE TO COVID-19 ONLY ONE VISITOR IS ALLOWED TO COME WITH YOU AND STAY IN THE WAITING ROOM ONLY DURING PRE OP AND PROCEDURE DAY OF SURGERY. THE 1 VISITOR MAY VISIT WITH YOU AFTER SURGERY IN YOUR PRIVATE ROOM DURING VISITING HOURS ONLY!  YOU NEED TO HAVE A COVID 19 TEST ON__Thursday 03/11/2021_____ @__200  pm_____, THIS TEST MUST BE DONE BEFORE SURGERY, COME     At [facility] [address] [place], [place]. Enter in using [place] entrance. ONCE YOUR COVID TEST IS COMPLETED, PLEASE BEGIN THE QUARANTINE INSTRUCTIONS AS OUTLINED IN YOUR HANDOUT.                Tara Bates     Your procedure is scheduled on: Monday 12/26/2019   Report to [facility]  Entrance    Report to admitting at   0800 AM     Call this number if you have problems the morning of surgery [phone]    Remember: Do not eat food  :After Midnight.     NO SOLID FOOD AFTER MIDNIGHT THE NIGHT PRIOR TO SURGERY  And  NOTHING BY MOUTH EXCEPT CLEAR LIQUIDS UNTIL  0730 am .     PLEASE FINISH ENSURE DRINK PER SURGEON ORDER  WHICH NEEDS TO BE COMPLETED AT  0730 am  .   CLEAR LIQUID DIET   Foods Allowed                                                                     Foods Excluded  Coffee and tea, regular and decaf                             liquids that you cannot  Plain Jell-O any favor except red or purple                                           see through such as: Fruit ices (not with fruit pulp)                                     milk, soups, orange juice  Iced Popsicles                                    All solid food Carbonated beverages, regular and diet                                    Cranberry, grape and apple juices Sports drinks like Gatorade Lightly seasoned clear broth or consume(fat free) Sugar, honey syrup  Sample Menu Breakfast                                Lunch  Supper Cranberry juice                    Beef broth                             Chicken broth Jell-O                                     Grape juice                           Apple juice Coffee or tea                        Jell-O                                      Popsicle                                                Coffee or tea                        Coffee or tea  _____________________________________________________________________     BRUSH YOUR TEETH MORNING OF SURGERY AND RINSE YOUR MOUTH OUT, NO CHEWING GUM CANDY OR MINTS.     Take these medicines the morning of surgery with A SIP OF WATER: Loratadine (Claritin)                                 You may not have any metal on your body including hair pins and              piercings  Do not wear jewelry, make-up, lotions, powders or perfumes, deodorant             Do not wear nail polish on your fingernails.  Do not shave  48 hours prior to surgery.                 Do not bring valuables to the hospital. [facility] IS NOT             RESPONSIBLE   FOR VALUABLES.  Contacts, dentures or bridgework may not be worn into surgery.  Leave suitcase in the car. After surgery it may be brought to your room.                   Please read over the following fact sheets you were given: _____________________________________________________________________             [facility] - Preparing for Surgery Before surgery, you can play an important role.  Because skin is not sterile, your skin needs to be as free of germs as possible.  You can reduce the number of germs on your skin by washing with CHG (chlorahexidine gluconate) soap before surgery.  CHG is an antiseptic cleaner which kills germs and bonds with the skin to continue killing germs even after washing. Please DO NOT use if you have an allergy to CHG or antibacterial soaps.  If your skin becomes reddened/irritated stop using the CHG and inform your nurse when you arrive at Short Stay. Do not shave (including legs and underarms)  for at least 48 hours prior to the first CHG shower.  You may shave your face/neck. Please follow these instructions carefully:  1.  Shower with CHG Soap the night before surgery and the  morning of Surgery.  2.  If you choose to wash your hair, wash your hair first as usual with your  normal  shampoo.  3.  After you shampoo, rinse your hair and body thoroughly to remove the  shampoo.                           4.  Use CHG as you would any other liquid soap.  You can apply chg directly  to the skin and wash                       Gently with a scrungie or clean washcloth.  5.  Apply the CHG Soap to your body ONLY FROM THE NECK DOWN.   Do not use on face/ open                           Wound or open sores. Avoid contact with eyes, ears mouth and genitals (private parts).                       Wash face,  Genitals (private parts) with your normal soap.             6.  Wash thoroughly, paying special attention to the area where your surgery  will be performed.  7.  Thoroughly rinse your body with warm water from the neck down.  8.  DO NOT shower/wash with your normal soap after using and rinsing off  the CHG Soap.                9.  Pat yourself dry with a clean towel.            10.  Wear clean pajamas.            11.  Place clean sheets on your bed the night of your first shower and do not  sleep with pets. Day of Surgery : Do not apply any lotions/deodorants the morning of surgery.  Please wear clean clothes to the hospital/surgery center.  FAILURE TO FOLLOW THESE INSTRUCTIONS MAY RESULT IN THE CANCELLATION OF YOUR SURGERY PATIENT SIGNATURE_________________________________  NURSE SIGNATURE__________________________________  ________________________________________________________________________   Tara Bates  An incentive spirometer is a tool that can help keep your lungs clear and active. This tool measures how well you are filling your lungs with each breath. Taking long deep  breaths may help reverse or decrease the chance of developing breathing (pulmonary) problems (especially infection) following:  A long period of time when you are unable to move or be active. BEFORE THE PROCEDURE   If the spirometer includes an indicator to show your best effort, your nurse or respiratory therapist will set it to a desired goal.  If possible, sit up straight or lean slightly forward. Try not to slouch.  Hold the incentive spirometer in an upright position. INSTRUCTIONS FOR USE  1. Sit on the edge of your bed if possible, or sit up as far as you can in bed or on  a chair. 2. Hold the incentive spirometer in an upright position. 3. Breathe out normally. 4. Place the mouthpiece in your mouth and seal your lips tightly around it. 5. Breathe in slowly and as deeply as possible, raising the piston or the ball toward the top of the column. 6. Hold your breath for 3-5 seconds or for as long as possible. Allow the piston or ball to fall to the bottom of the column. 7. Remove the mouthpiece from your mouth and breathe out normally. 8. Rest for a few seconds and repeat Steps 1 through 7 at least 10 times every 1-2 hours when you are awake. Take your time and take a few normal breaths between deep breaths. 9. The spirometer may include an indicator to show your best effort. Use the indicator as a goal to work toward during each repetition. 10. After each set of 10 deep breaths, practice coughing to be sure your lungs are clear. If you have an incision (the cut made at the time of surgery), support your incision when coughing by placing a pillow or rolled up towels firmly against it. Once you are able to get out of bed, walk around indoors and cough well. You may stop using the incentive spirometer when instructed by your caregiver.  RISKS AND COMPLICATIONS  Take your time so you do not get dizzy or light-headed.  If you are in pain, you may need to take or ask for pain medication before  doing incentive spirometry. It is harder to take a deep breath if you are having pain. AFTER USE  Rest and breathe slowly and easily.  It can be helpful to keep track of a log of your progress. Your caregiver can provide you with a simple table to help with this. If you are using the spirometer at home, follow these instructions: [place] IF:   You are having difficultly using the spirometer.  You have trouble using the spirometer as often as instructed.  Your pain medication is not giving enough relief while using the spirometer.  You develop fever of 100.5 F (38.1 C) or higher. SEEK IMMEDIATE MEDICAL CARE IF:   You cough up bloody sputum that had not been present before.  You develop fever of 102 F (38.9 C) or greater.  You develop worsening pain at or near the incision site. MAKE SURE YOU:   Understand these instructions.  Will watch your condition.  Will get help right away if you are not doing well or get worse. Document Released: 02/09/2007 Document Revised: 12/22/2011 Document Reviewed: 04/12/2007 Exit[employer] Patient Information 2014 Exit[employer], [place].   ________________________________________________________________________  WHAT IS A BLOOD TRANSFUSION? Blood Transfusion Information  A transfusion is the replacement of blood or some of its parts. Blood is made up of multiple cells which provide different functions.  Red blood cells carry oxygen and are used for blood loss replacement.  White blood cells fight against infection.  Platelets control bleeding.  Plasma helps clot blood.  Other blood products are available for specialized needs, such as hemophilia or other clotting disorders. BEFORE THE TRANSFUSION  Who gives blood for transfusions?   Healthy volunteers who are fully evaluated to make sure their blood is safe. This is blood bank blood. Transfusion therapy is the safest it has ever been in the practice of medicine. Before blood is taken  from a donor, a complete history is taken to make sure that person has no history of diseases nor engages in risky social behavior (  examples are intravenous drug use or sexual activity with multiple partners). The donor's travel history is screened to minimize risk of transmitting infections, such as malaria. The donated blood is tested for signs of infectious diseases, such as HIV and hepatitis. The blood is then tested to be sure it is compatible with you in order to minimize the chance of a transfusion reaction. If you or a relative donates blood, this is often done in anticipation of surgery and is not appropriate for emergency situations. It takes many days to process the donated blood. RISKS AND COMPLICATIONS Although transfusion therapy is very safe and saves many lives, the main dangers of transfusion include:   Getting an infectious disease.  Developing a transfusion reaction. This is an allergic reaction to something in the blood you were given. Every precaution is taken to prevent this. The decision to have a blood transfusion has been considered carefully by your caregiver before blood is given. Blood is not given unless the benefits outweigh the risks. AFTER THE TRANSFUSION  Right after receiving a blood transfusion, you will usually feel much better and more energetic. This is especially true if your red blood cells have gotten low (anemic). The transfusion raises the level of the red blood cells which carry oxygen, and this usually causes an energy increase.  The nurse administering the transfusion will monitor you carefully for complications. HOME CARE INSTRUCTIONS  No special instructions are needed after a transfusion. You may find your energy is better. Speak with your caregiver about any limitations on activity for underlying diseases you may have. SEEK MEDICAL CARE IF:   Your condition is not improving after your transfusion.  You develop redness or irritation at the  intravenous (IV) site. SEEK IMMEDIATE MEDICAL CARE IF:  Any of the following symptoms occur over the next 12 hours:  Shaking chills.  You have a temperature by mouth above 102 F (38.9 C), not controlled by medicine.  Chest, back, or muscle pain.  People around you feel you are not acting correctly or are confused.  Shortness of breath or difficulty breathing.  Dizziness and fainting.  You get a rash or develop hives.  You have a decrease in urine output.  Your urine turns a dark color or changes to pink, red, or brown. Any of the following symptoms occur over the next 10 days:  You have a temperature by mouth above 102 F (38.9 C), not controlled by medicine.  Shortness of breath.  Weakness after normal activity.  The white part of the eye turns yellow (jaundice).  You have a decrease in the amount of urine or are urinating less often.  Your urine turns a dark color or changes to pink, red, or brown. Document Released: 09/26/2000 Document Revised: 12/22/2011 Document Reviewed: 05/15/2008 [facility] Patient Information 2014 [place], [place].  _______________________________________________________________________

## 2019-12-19 ENCOUNTER — Other Ambulatory Visit: Payer: Self-pay

## 2019-12-19 ENCOUNTER — Encounter (HOSPITAL_COMMUNITY)
Admission: RE | Admit: 2019-12-19 | Discharge: 2019-12-19 | Disposition: A | Discharge status: Home / Self Care | Payer: Federal, State, Local not specified - PPO | Source: Ambulatory Visit | Attending: Orthopedic Surgery | Admitting: Orthopedic Surgery

## 2019-12-19 ENCOUNTER — Encounter (HOSPITAL_COMMUNITY): Payer: Self-pay

## 2019-12-19 DIAGNOSIS — Z01812 Encounter for preprocedural laboratory examination: Secondary | ICD-10-CM | POA: Insufficient documentation

## 2019-12-19 NOTE — Progress Notes (Addendum)
PCP - Dr. Fara Chute [place]  Medical clearance on chart from 12/08/2019  Cardiologist - n/a  12/06/2019- labs from Dr. Neita Carp on chart-CBC, CMP, UA, PT, HgA1C  Chest x-ray - n/a EKG - 12/08/2019 on chart from Dr. Neita Carp Stress Test - n/a ECHO - n/a Cardiac Cath -n/a   Sleep Study - n/a CPAP - n/a  Fasting Blood Sugar - n/a Checks Blood Sugar __0___ times a day  Blood Thinner Instructions:n/a Aspirin Instructions:n/a Last Dose:n/a  Anesthesia review:  Patient has a history of anemia, and arthritis .  Patient denies shortness of breath, fever, cough and chest pain at PAT appointment   Patient verbalized understanding of instructions that were given to them at the PAT appointment. Patient was also instructed that they will need to review over the PAT instructions again at home before surgery.

## 2019-12-20 ENCOUNTER — Encounter (HOSPITAL_COMMUNITY)
Admission: RE | Admit: 2019-12-20 | Discharge: 2019-12-20 | Disposition: A | Discharge status: Home / Self Care | Payer: Federal, State, Local not specified - PPO | Source: Ambulatory Visit | Attending: Orthopedic Surgery | Admitting: Orthopedic Surgery

## 2019-12-20 DIAGNOSIS — Z01812 Encounter for preprocedural laboratory examination: Secondary | ICD-10-CM | POA: Diagnosis not present

## 2019-12-20 LAB — APTT: aPTT: 27 seconds (ref 24–36)

## 2019-12-20 LAB — COMPREHENSIVE METABOLIC PANEL
ALT: 31 U/L (ref 0–44)
AST: 30 U/L (ref 15–41)
Albumin: 4.3 g/dL (ref 3.5–5.0)
Alkaline Phosphatase: 60 U/L (ref 38–126)
Anion gap: 6 (ref 5–15)
BUN: 14 mg/dL (ref 8–23)
CO2: 28 mmol/L (ref 22–32)
Calcium: 9.2 mg/dL (ref 8.9–10.3)
Chloride: 106 mmol/L (ref 98–111)
Creatinine, Ser: 0.75 mg/dL (ref 0.44–1.00)
GFR calc Af Amer: >60 mL/min (ref >60)
GFR calc non Af Amer: >60 mL/min (ref >60)
Glucose, Bld: 81 mg/dL (ref 70–99)
Potassium: 4.1 mmol/L (ref 3.5–5.1)
Sodium: 140 mmol/L (ref 135–145)
Total Bilirubin: 1.1 mg/dL (ref 0.3–1.2)
Total Protein: 7 g/dL (ref 6.5–8.1)

## 2019-12-20 LAB — CBC
HCT: 41 % (ref 36.0–46.0)
Hemoglobin: 12.9 g/dL (ref 12.0–15.0)
MCH: 29.4 pg (ref 26.0–34.0)
MCHC: 31.5 g/dL (ref 30.0–36.0)
MCV: 93.4 fL (ref 80.0–100.0)
Platelets: 261 10*3/uL (ref 150–400)
RBC: 4.39 MIL/uL (ref 3.87–5.11)
RDW: 14.6 % (ref 11.5–15.5)
WBC: 4.3 10*3/uL (ref 4.0–10.5)
nRBC: 0 % (ref 0.0–0.2)

## 2019-12-20 LAB — PROTIME-INR
INR: 1 (ref 0.8–1.2)
Prothrombin Time: 13.2 seconds (ref 11.4–15.2)

## 2019-12-21 ENCOUNTER — Other Ambulatory Visit (HOSPITAL_COMMUNITY): Payer: Self-pay | Admitting: *Deleted

## 2019-12-22 ENCOUNTER — Other Ambulatory Visit: Payer: Self-pay

## 2019-12-22 ENCOUNTER — Other Ambulatory Visit (HOSPITAL_COMMUNITY)
Admission: RE | Admit: 2019-12-22 | Discharge: 2019-12-22 | Disposition: A | Discharge status: Home / Self Care | Payer: Federal, State, Local not specified - PPO | Source: Ambulatory Visit | Attending: Orthopedic Surgery | Admitting: Orthopedic Surgery

## 2019-12-22 DIAGNOSIS — Z20822 Contact with and (suspected) exposure to covid-19: Secondary | ICD-10-CM | POA: Insufficient documentation

## 2019-12-22 DIAGNOSIS — Z01812 Encounter for preprocedural laboratory examination: Secondary | ICD-10-CM | POA: Diagnosis present

## 2019-12-23 LAB — SARS CORONAVIRUS 2 (TAT 6-24 HRS): SARS Coronavirus 2: NEGATIVE

## 2019-12-25 MED ORDER — BUPIVACAINE LIPOSOME 1.3 % IJ SUSP
20.0000 mL | Freq: Once | INTRAMUSCULAR | Status: AC
  Filled 2019-12-25: qty 20

## 2019-12-26 ENCOUNTER — Encounter (HOSPITAL_COMMUNITY)
Admission: RE | Disposition: A | Discharge status: Home / Self Care | Payer: Self-pay | Source: Home / Self Care | Attending: Orthopedic Surgery

## 2019-12-26 ENCOUNTER — Other Ambulatory Visit: Payer: Self-pay

## 2019-12-26 ENCOUNTER — Encounter (HOSPITAL_COMMUNITY): Payer: Self-pay | Admitting: Orthopedic Surgery

## 2019-12-26 ENCOUNTER — Ambulatory Visit (HOSPITAL_COMMUNITY): Payer: Federal, State, Local not specified - PPO | Admitting: Anesthesiology

## 2019-12-26 ENCOUNTER — Ambulatory Visit (HOSPITAL_COMMUNITY)
Admission: RE | Admit: 2019-12-26 | Discharge: 2019-12-27 | Disposition: A | Discharge status: Home / Self Care | Payer: Federal, State, Local not specified - PPO | Attending: Orthopedic Surgery | Admitting: Orthopedic Surgery

## 2019-12-26 DIAGNOSIS — Z79899 Other long term (current) drug therapy: Secondary | ICD-10-CM | POA: Diagnosis not present

## 2019-12-26 DIAGNOSIS — Z885 Allergy status to narcotic agent status: Secondary | ICD-10-CM | POA: Insufficient documentation

## 2019-12-26 DIAGNOSIS — R262 Difficulty in walking, not elsewhere classified: Secondary | ICD-10-CM | POA: Insufficient documentation

## 2019-12-26 DIAGNOSIS — Z881 Allergy status to other antibiotic agents status: Secondary | ICD-10-CM | POA: Insufficient documentation

## 2019-12-26 DIAGNOSIS — Z96652 Presence of left artificial knee joint: Secondary | ICD-10-CM | POA: Diagnosis not present

## 2019-12-26 DIAGNOSIS — M6281 Muscle weakness (generalized): Secondary | ICD-10-CM | POA: Insufficient documentation

## 2019-12-26 DIAGNOSIS — M179 Osteoarthritis of knee, unspecified: Secondary | ICD-10-CM | POA: Diagnosis present

## 2019-12-26 DIAGNOSIS — M1711 Unilateral primary osteoarthritis, right knee: Secondary | ICD-10-CM | POA: Diagnosis not present

## 2019-12-26 DIAGNOSIS — M1710 Unilateral primary osteoarthritis, unspecified knee: Secondary | ICD-10-CM | POA: Diagnosis present

## 2019-12-26 HISTORY — PX: TOTAL KNEE ARTHROPLASTY: SHX125

## 2019-12-26 LAB — TYPE AND SCREEN
ABO/RH(D): A POS
Antibody Screen: NEGATIVE

## 2019-12-26 LAB — SURGICAL PCR SCREEN
MRSA, PCR: NEGATIVE
Staphylococcus aureus: NEGATIVE

## 2019-12-26 SURGERY — ARTHROPLASTY, KNEE, TOTAL
Anesthesia: Regional | Site: Knee | Laterality: Right

## 2019-12-26 MED ORDER — PHENOL 1.4 % MT LIQD: 1.0000 | OROMUCOSAL | Status: DC | PRN

## 2019-12-26 MED ORDER — HYDROMORPHONE HCL 2 MG PO TABS: 2.0000 mg | ORAL_TABLET | ORAL | Status: DC | PRN

## 2019-12-26 MED ORDER — ONDANSETRON HCL 4 MG/2ML IJ SOLN
INTRAMUSCULAR | Status: DC | PRN
  Administered 2019-12-26: 4 mg via INTRAVENOUS

## 2019-12-26 MED ORDER — FENTANYL CITRATE (PF) 100 MCG/2ML IJ SOLN: 25.0000 ug | INTRAMUSCULAR | Status: DC | PRN

## 2019-12-26 MED ORDER — DEXAMETHASONE SODIUM PHOSPHATE 10 MG/ML IJ SOLN: 8.0000 mg | Freq: Once | INTRAMUSCULAR | Status: AC

## 2019-12-26 MED ORDER — STERILE WATER FOR IRRIGATION IR SOLN
Status: DC | PRN
  Administered 2019-12-26 (×2): 1000 mL

## 2019-12-26 MED ORDER — ACETAMINOPHEN 500 MG PO TABS: 1000.0000 mg | ORAL_TABLET | Freq: Four times a day (QID) | ORAL | Status: DC

## 2019-12-26 MED ORDER — BUPIVACAINE LIPOSOME 1.3 % IJ SUSP
INTRAMUSCULAR | Status: DC | PRN
  Administered 2019-12-26: 20 mL

## 2019-12-26 MED ORDER — PROPOFOL 500 MG/50ML IV EMUL
INTRAVENOUS | Status: AC
  Filled 2019-12-26: qty 50

## 2019-12-26 MED ORDER — TRAMADOL HCL 50 MG PO TABS
50.0000 mg | ORAL_TABLET | Freq: Four times a day (QID) | ORAL | Status: DC | PRN
  Administered 2019-12-26: 100 mg via ORAL
  Filled 2019-12-26: qty 2

## 2019-12-26 MED ORDER — EPHEDRINE 5 MG/ML INJ
INTRAVENOUS | Status: AC
  Filled 2019-12-26: qty 10

## 2019-12-26 MED ORDER — BUPIVACAINE IN DEXTROSE 0.75-8.25 % IT SOLN
INTRATHECAL | Status: DC | PRN
  Administered 2019-12-26: 1.6 mL via INTRATHECAL

## 2019-12-26 MED ORDER — ROPIVACAINE HCL 5 MG/ML IJ SOLN
INTRAMUSCULAR | Status: DC | PRN
  Administered 2019-12-26: 20 mL via PERINEURAL

## 2019-12-26 MED ORDER — CEFAZOLIN SODIUM-DEXTROSE 2-4 GM/100ML-% IV SOLN
2.0000 g | Freq: Four times a day (QID) | INTRAVENOUS | Status: AC
  Administered 2019-12-26 – 2019-12-27 (×2): 2 g via INTRAVENOUS
  Filled 2019-12-26 (×2): qty 100

## 2019-12-26 MED ORDER — ASPIRIN EC 325 MG PO TBEC
325.0000 mg | DELAYED_RELEASE_TABLET | Freq: Two times a day (BID) | ORAL | Status: DC
  Administered 2019-12-27: 325 mg via ORAL
  Filled 2019-12-26: qty 1

## 2019-12-26 MED ORDER — DOCUSATE SODIUM 100 MG PO CAPS
100.0000 mg | ORAL_CAPSULE | Freq: Two times a day (BID) | ORAL | Status: DC
  Administered 2019-12-26 – 2019-12-27 (×2): 100 mg via ORAL
  Filled 2019-12-26 (×2): qty 1

## 2019-12-26 MED ORDER — SODIUM CHLORIDE 0.9 % IV SOLN: INTRAVENOUS | Status: DC

## 2019-12-26 MED ORDER — PHENYLEPHRINE 40 MCG/ML (10ML) SYRINGE FOR IV PUSH (FOR BLOOD PRESSURE SUPPORT)
PREFILLED_SYRINGE | INTRAVENOUS | Status: AC
  Filled 2019-12-26: qty 10

## 2019-12-26 MED ORDER — LORATADINE 10 MG PO TABS
10.0000 mg | ORAL_TABLET | Freq: Every day | ORAL | Status: DC
  Administered 2019-12-27: 10 mg via ORAL
  Filled 2019-12-26: qty 1

## 2019-12-26 MED ORDER — MIDAZOLAM HCL 2 MG/2ML IJ SOLN
1.0000 mg | Freq: Once | INTRAMUSCULAR | Status: AC
  Administered 2019-12-26: 2 mg via INTRAVENOUS
  Filled 2019-12-26: qty 2

## 2019-12-26 MED ORDER — DEXAMETHASONE SODIUM PHOSPHATE 10 MG/ML IJ SOLN
10.0000 mg | Freq: Once | INTRAMUSCULAR | Status: AC
  Administered 2019-12-27: 10 mg via INTRAVENOUS
  Filled 2019-12-26: qty 1

## 2019-12-26 MED ORDER — MENTHOL 3 MG MT LOZG: 1.0000 | LOZENGE | OROMUCOSAL | Status: DC | PRN

## 2019-12-26 MED ORDER — PROPOFOL 10 MG/ML IV BOLUS
INTRAVENOUS | Status: AC
  Filled 2019-12-26: qty 20

## 2019-12-26 MED ORDER — METHOCARBAMOL 1000 MG/10ML IJ SOLN
500.0000 mg | Freq: Four times a day (QID) | INTRAVENOUS | Status: DC | PRN
  Filled 2019-12-26: qty 5

## 2019-12-26 MED ORDER — EPHEDRINE SULFATE-NACL 50-0.9 MG/10ML-% IV SOSY
PREFILLED_SYRINGE | INTRAVENOUS | Status: DC | PRN
  Administered 2019-12-26 (×3): 10 mg via INTRAVENOUS

## 2019-12-26 MED ORDER — ONDANSETRON HCL 4 MG/2ML IJ SOLN: 4.0000 mg | Freq: Four times a day (QID) | INTRAMUSCULAR | Status: DC | PRN

## 2019-12-26 MED ORDER — SODIUM CHLORIDE (PF) 0.9 % IJ SOLN
INTRAMUSCULAR | Status: DC | PRN
  Administered 2019-12-26: 60 mL

## 2019-12-26 MED ORDER — SODIUM CHLORIDE 0.9 % IR SOLN
Status: DC | PRN
  Administered 2019-12-26: 1000 mL

## 2019-12-26 MED ORDER — POVIDONE-IODINE 10 % EX SWAB
2.0000 "application " | Freq: Once | CUTANEOUS | Status: AC
  Administered 2019-12-26: 2 via TOPICAL

## 2019-12-26 MED ORDER — ONDANSETRON HCL 4 MG PO TABS: 4.0000 mg | ORAL_TABLET | Freq: Four times a day (QID) | ORAL | Status: DC | PRN

## 2019-12-26 MED ORDER — TRANEXAMIC ACID-NACL 1000-0.7 MG/100ML-% IV SOLN
1000.0000 mg | INTRAVENOUS | Status: AC
  Administered 2019-12-26: 1000 mg via INTRAVENOUS
  Filled 2019-12-26: qty 100

## 2019-12-26 MED ORDER — ACETAMINOPHEN 10 MG/ML IV SOLN
1000.0000 mg | Freq: Four times a day (QID) | INTRAVENOUS | Status: DC
  Administered 2019-12-26: 1000 mg via INTRAVENOUS
  Filled 2019-12-26: qty 100

## 2019-12-26 MED ORDER — ONDANSETRON HCL 4 MG/2ML IJ SOLN
INTRAMUSCULAR | Status: AC
  Filled 2019-12-26: qty 2

## 2019-12-26 MED ORDER — METOCLOPRAMIDE HCL 5 MG PO TABS: 5.0000 mg | ORAL_TABLET | Freq: Three times a day (TID) | ORAL | Status: DC | PRN

## 2019-12-26 MED ORDER — PROPOFOL 10 MG/ML IV BOLUS
INTRAVENOUS | Status: DC | PRN
  Administered 2019-12-26 (×3): 20 mg via INTRAVENOUS
  Administered 2019-12-26: 10 mg via INTRAVENOUS

## 2019-12-26 MED ORDER — DIPHENHYDRAMINE HCL 12.5 MG/5ML PO ELIX: 12.5000 mg | ORAL_SOLUTION | ORAL | Status: DC | PRN

## 2019-12-26 MED ORDER — 0.9 % SODIUM CHLORIDE (POUR BTL) OPTIME
TOPICAL | Status: DC | PRN
  Administered 2019-12-26: 1000 mL

## 2019-12-26 MED ORDER — DEXAMETHASONE SODIUM PHOSPHATE 10 MG/ML IJ SOLN
INTRAMUSCULAR | Status: DC | PRN
  Administered 2019-12-26: 5 mg

## 2019-12-26 MED ORDER — LACTATED RINGERS IV SOLN: INTRAVENOUS | Status: DC

## 2019-12-26 MED ORDER — DEXAMETHASONE SODIUM PHOSPHATE 10 MG/ML IJ SOLN
INTRAMUSCULAR | Status: DC | PRN
  Administered 2019-12-26: 10 mg via INTRAVENOUS

## 2019-12-26 MED ORDER — HYDROMORPHONE HCL 1 MG/ML IJ SOLN: 0.5000 mg | INTRAMUSCULAR | Status: DC | PRN

## 2019-12-26 MED ORDER — PHENYLEPHRINE 40 MCG/ML (10ML) SYRINGE FOR IV PUSH (FOR BLOOD PRESSURE SUPPORT)
PREFILLED_SYRINGE | INTRAVENOUS | Status: DC | PRN
  Administered 2019-12-26 (×5): 80 ug via INTRAVENOUS

## 2019-12-26 MED ORDER — CEFAZOLIN SODIUM-DEXTROSE 2-4 GM/100ML-% IV SOLN
2.0000 g | INTRAVENOUS | Status: AC
  Administered 2019-12-26: 2 g via INTRAVENOUS
  Filled 2019-12-26: qty 100

## 2019-12-26 MED ORDER — POLYETHYLENE GLYCOL 3350 17 G PO PACK: 17.0000 g | PACK | Freq: Every day | ORAL | Status: DC | PRN

## 2019-12-26 MED ORDER — ACETAMINOPHEN 500 MG PO TABS
1000.0000 mg | ORAL_TABLET | Freq: Four times a day (QID) | ORAL | Status: DC
  Administered 2019-12-26 – 2019-12-27 (×3): 1000 mg via ORAL
  Filled 2019-12-26 (×3): qty 2

## 2019-12-26 MED ORDER — FLUTICASONE PROPIONATE 50 MCG/ACT NA SUSP
1.0000 | Freq: Every day | NASAL | Status: DC
  Filled 2019-12-26: qty 16

## 2019-12-26 MED ORDER — GABAPENTIN 100 MG PO CAPS
200.0000 mg | ORAL_CAPSULE | Freq: Three times a day (TID) | ORAL | Status: DC
  Administered 2019-12-26 – 2019-12-27 (×3): 200 mg via ORAL
  Filled 2019-12-26 (×3): qty 2

## 2019-12-26 MED ORDER — PROPOFOL 500 MG/50ML IV EMUL
INTRAVENOUS | Status: DC | PRN
  Administered 2019-12-26: 75 ug/kg/min via INTRAVENOUS

## 2019-12-26 MED ORDER — METOCLOPRAMIDE HCL 5 MG/ML IJ SOLN: 5.0000 mg | Freq: Three times a day (TID) | INTRAMUSCULAR | Status: DC | PRN

## 2019-12-26 MED ORDER — BISACODYL 10 MG RE SUPP: 10.0000 mg | Freq: Every day | RECTAL | Status: DC | PRN

## 2019-12-26 MED ORDER — FLUTICASONE PROPIONATE 50 MCG/ACT NA SUSP
2.0000 | Freq: Every day | NASAL | Status: DC
  Filled 2019-12-26: qty 16

## 2019-12-26 MED ORDER — DEXAMETHASONE SODIUM PHOSPHATE 10 MG/ML IJ SOLN
INTRAMUSCULAR | Status: AC
  Filled 2019-12-26: qty 1

## 2019-12-26 MED ORDER — METHOCARBAMOL 500 MG PO TABS
500.0000 mg | ORAL_TABLET | Freq: Four times a day (QID) | ORAL | Status: DC | PRN
  Administered 2019-12-26 – 2019-12-27 (×2): 500 mg via ORAL
  Filled 2019-12-26 (×2): qty 1

## 2019-12-26 MED ORDER — FLUTICASONE PROPIONATE 50 MCG/ACT NA SUSP: 3.0000 | Freq: Every day | NASAL | Status: DC

## 2019-12-26 MED ORDER — FLEET ENEMA 7-19 GM/118ML RE ENEM: 1.0000 | ENEMA | Freq: Once | RECTAL | Status: DC | PRN

## 2019-12-26 MED ORDER — CHLORHEXIDINE GLUCONATE 4 % EX LIQD: 60.0000 mL | Freq: Once | CUTANEOUS | Status: DC

## 2019-12-26 MED ORDER — FENTANYL CITRATE (PF) 100 MCG/2ML IJ SOLN
50.0000 ug | Freq: Once | INTRAMUSCULAR | Status: AC
  Administered 2019-12-26: 50 ug via INTRAVENOUS
  Filled 2019-12-26: qty 2

## 2019-12-26 SURGICAL SUPPLY — 61 items
ATTUNE MED DOME PAT 38 KNEE (Knees) ×1 IMPLANT
ATTUNE PS FEM RT SZ 7 CEM KNEE (Femur) ×1 IMPLANT
ATTUNE PSRP INSR SZ7 6 KNEE (Insert) ×1 IMPLANT
BAG SPEC THK2 15X12 ZIP CLS (MISCELLANEOUS) ×1
BAG ZIPLOCK 12X15 (MISCELLANEOUS) ×2 IMPLANT
BASE TIBIA ATTUNE KNEE SYS SZ6 (Knees) IMPLANT
BLADE SAG 18X100X1.27 (BLADE) ×2 IMPLANT
BLADE SAW SGTL 11.0X1.19X90.0M (BLADE) ×2 IMPLANT
BLADE SURG SZ10 CARB STEEL (BLADE) ×4 IMPLANT
BNDG ELASTIC 6X5.8 VLCR STR LF (GAUZE/BANDAGES/DRESSINGS) ×2 IMPLANT
BOWL SMART MIX CTS (DISPOSABLE) ×2 IMPLANT
BSPLAT TIB 6 CMNT ROT PLAT STR (Knees) ×1 IMPLANT
CEMENT HV SMART SET (Cement) ×4 IMPLANT
COVER SURGICAL LIGHT HANDLE (MISCELLANEOUS) ×2 IMPLANT
COVER WAND RF STERILE (DRAPES) IMPLANT
CUFF TOURN SGL QUICK 34 (TOURNIQUET CUFF) ×2
CUFF TRNQT CYL 34X4.125X (TOURNIQUET CUFF) ×1 IMPLANT
DECANTER SPIKE VIAL GLASS SM (MISCELLANEOUS) ×3 IMPLANT
DRAPE U-SHAPE 47X51 STRL (DRAPES) ×2 IMPLANT
DRSG ADAPTIC 3X8 NADH LF (GAUZE/BANDAGES/DRESSINGS) ×1 IMPLANT
DRSG AQUACEL AG ADV 3.5X10 (GAUZE/BANDAGES/DRESSINGS) ×2 IMPLANT
DRSG PAD ABDOMINAL 8X10 ST (GAUZE/BANDAGES/DRESSINGS) ×1 IMPLANT
DURAPREP 26ML APPLICATOR (WOUND CARE) ×2 IMPLANT
ELECT REM PT RETURN 15FT ADLT (MISCELLANEOUS) ×2 IMPLANT
EVACUATOR 1/8 PVC DRAIN (DRAIN) ×2 IMPLANT
GAUZE SPONGE 2X2 8PLY STRL LF (GAUZE/BANDAGES/DRESSINGS) ×1 IMPLANT
GAUZE SPONGE 4X4 12PLY STRL (GAUZE/BANDAGES/DRESSINGS) ×1 IMPLANT
GLOVE BIO SURGEON STRL SZ7 (GLOVE) ×2 IMPLANT
GLOVE BIO SURGEON STRL SZ8 (GLOVE) ×2 IMPLANT
GLOVE BIOGEL PI IND STRL 7.0 (GLOVE) ×1 IMPLANT
GLOVE BIOGEL PI IND STRL 8 (GLOVE) ×1 IMPLANT
GLOVE BIOGEL PI INDICATOR 7.0 (GLOVE) ×1
GLOVE BIOGEL PI INDICATOR 8 (GLOVE) ×1
GOWN STRL REUS W/TWL LRG LVL3 (GOWN DISPOSABLE) ×4 IMPLANT
HANDPIECE INTERPULSE COAX TIP (DISPOSABLE) ×2
HOLDER FOLEY CATH W/STRAP (MISCELLANEOUS) ×1 IMPLANT
IMMOBILIZER KNEE 20 (SOFTGOODS) ×2
IMMOBILIZER KNEE 20 THIGH 36 (SOFTGOODS) ×1 IMPLANT
KIT TURNOVER KIT A (KITS) IMPLANT
MANIFOLD NEPTUNE II (INSTRUMENTS) ×2 IMPLANT
NS IRRIG 1000ML POUR BTL (IV SOLUTION) ×2 IMPLANT
PACK TOTAL KNEE CUSTOM (KITS) ×2 IMPLANT
PADDING CAST COTTON 6X4 STRL (CAST SUPPLIES) ×5 IMPLANT
PENCIL SMOKE EVACUATOR (MISCELLANEOUS) ×1 IMPLANT
PIN DRILL FIX HALF THREAD (BIT) ×1 IMPLANT
PIN STEINMAN FIXATION KNEE (PIN) ×1 IMPLANT
PROTECTOR NERVE ULNAR (MISCELLANEOUS) ×2 IMPLANT
SET HNDPC FAN SPRY TIP SCT (DISPOSABLE) ×1 IMPLANT
SPONGE GAUZE 2X2 STER 10/PKG (GAUZE/BANDAGES/DRESSINGS) ×1
STRIP CLOSURE SKIN 1/2X4 (GAUZE/BANDAGES/DRESSINGS) ×4 IMPLANT
SUT MNCRL AB 4-0 PS2 18 (SUTURE) ×2 IMPLANT
SUT STRATAFIX 0 PDS 27 VIOLET (SUTURE) ×2
SUT VIC AB 2-0 CT1 27 (SUTURE) ×6
SUT VIC AB 2-0 CT1 TAPERPNT 27 (SUTURE) ×3 IMPLANT
SUTURE STRATFX 0 PDS 27 VIOLET (SUTURE) ×1 IMPLANT
TIBIA ATTUNE KNEE SYS BASE SZ6 (Knees) ×2 IMPLANT
TRAY FOLEY MTR SLVR 14FR STAT (SET/KITS/TRAYS/PACK) ×1 IMPLANT
TRAY FOLEY MTR SLVR 16FR STAT (SET/KITS/TRAYS/PACK) ×1 IMPLANT
WATER STERILE IRR 1000ML POUR (IV SOLUTION) ×4 IMPLANT
WRAP KNEE MAXI GEL POST OP (GAUZE/BANDAGES/DRESSINGS) ×2 IMPLANT
YANKAUER SUCT BULB TIP 10FT TU (MISCELLANEOUS) ×2 IMPLANT

## 2019-12-26 NOTE — Transfer of Care (Signed)
Immediate Anesthesia Transfer of Care Note  Patient: Tara Bates  Procedure(s) Performed: RIGHT TOTAL KNEE ARTHROPLASTY (Right Knee)  Patient Location: PACU  Anesthesia Type:Spinal and MAC combined with regional for post-op pain  Level of Consciousness: awake, oriented and patient cooperative  Airway & Oxygen Therapy: Patient Spontanous Breathing and Patient connected to face mask oxygen  Post-op Assessment: Report given to RN and Post -op Vital signs reviewed and stable  Post vital signs: Reviewed and stable  Last Vitals:  Vitals Value Taken Time  BP 107/66 12/26/19 1222  Temp    Pulse 85 12/26/19 1224  Resp 18 12/26/19 1224  SpO2 100 % 12/26/19 1224  Vitals shown include unvalidated device data.  Last Pain:  Vitals:   12/26/19 0933  TempSrc:   PainSc: 0-No pain         Complications: No apparent anesthesia complications

## 2019-12-26 NOTE — Anesthesia Postprocedure Evaluation (Signed)
Anesthesia Post Note  Patient: Tara Bates  Procedure(s) Performed: RIGHT TOTAL KNEE ARTHROPLASTY (Right Knee)     Patient location during evaluation: PACU Anesthesia Type: Regional and Spinal Level of consciousness: oriented and awake and alert Pain management: pain level controlled Vital Signs Assessment: post-procedure vital signs reviewed and stable Respiratory status: spontaneous breathing, respiratory function stable and patient connected to nasal cannula oxygen Cardiovascular status: blood pressure returned to baseline and stable Postop Assessment: no headache, no backache and no apparent nausea or vomiting Anesthetic complications: no    Last Vitals:  Vitals:   12/26/19 1504 12/26/19 1551  BP: 116/66 118/65  Pulse: (!) 58 74  Resp: 16 17  Temp: 36.6 C 36.6 C  SpO2: 100% 100%    Last Pain:  Vitals:   12/26/19 1551  TempSrc: Oral  PainSc:                  Tara Bates [name]

## 2019-12-26 NOTE — Discharge Instructions (Addendum)
 Tara Aluisio, MD Total [facility] Specialist [facility] [address] [place], [place] [address] [phone]  TOTAL KNEE REPLACEMENT POSTOPERATIVE DIRECTIONS    Knee Rehabilitation, Guidelines Following Surgery  Results after knee surgery are often greatly improved when you follow the exercise, range of motion and muscle strengthening exercises prescribed by your doctor. Safety measures are also important to protect the knee from further injury. If any of these exercises cause you to have increased pain or swelling in your knee joint, decrease the amount until you are comfortable again and slowly increase them. If you have problems or questions, call your caregiver or physical therapist for advice.   BLOOD CLOT PREVENTION . Take a 325 mg Aspirin two times a day for three weeks following surgery. Then take an 81 mg Aspirin once a day for three weeks. Then discontinue Aspirin. . You may resume your vitamins/supplements upon discharge from the hospital. . Do not take any NSAIDs (Advil, Aleve, Ibuprofen, Meloxicam, etc.) until you have discontinued the 325 mg Aspirin.  HOME CARE INSTRUCTIONS  . Remove items at home which could result in a fall. This includes throw rugs or furniture in walking pathways.  . ICE to the affected knee as much as tolerated. Icing helps control swelling. If the swelling is well controlled you will be more comfortable and rehab easier. Continue to use ice on the knee for pain and swelling from surgery. You may notice swelling that will progress down to the foot and ankle. This is normal after surgery. Elevate the leg when you are not up walking on it.    . Continue to use the breathing machine which will help keep your temperature down. It is common for your temperature to cycle up and down following surgery, especially at night when you are not up moving around and exerting yourself. The breathing machine keeps your lungs expanded and your  temperature down. . Do not place pillow under the operative knee, focus on keeping the knee straight while resting  DIET You may resume your previous home diet once you are discharged from the hospital.  DRESSING / WOUND CARE / SHOWERING . Keep your bulky bandage on for 2 days. On the third post-operative day you may remove the Ace bandage and gauze. There is a waterproof adhesive bandage on your skin which will stay in place until your first follow-up appointment. Once you remove this you will not need to place another bandage . You may begin showering 3 days following surgery, but do not submerge the incision under water.  ACTIVITY For the first 5 days, the key is rest and control of pain and swelling . Do your home exercises twice a day starting on post-operative day 3. On the days you go to physical therapy, just do the home exercises once that day. . You should rest, ice and elevate the leg for 50 minutes out of every hour. Get up and walk/stretch for 10 minutes per hour. After 5 days you can increase your activity slowly as tolerated. . Walk with your walker as instructed. Use the walker until you are comfortable transitioning to a cane. Walk with the cane in the opposite hand of the operative leg. You may discontinue the cane once you are comfortable and walking steadily. . Avoid periods of inactivity such as sitting longer than an hour when not asleep. This helps prevent blood clots.  . You may discontinue the knee immobilizer once you are able to perform a straight   leg raise while lying down. . You may resume a sexual relationship in one month or when given the OK by your doctor.  . You may return to work once you are cleared by your doctor.  . Do not drive a car for 6 weeks or until released by your surgeon.  . Do not drive while taking narcotics.  TED HOSE STOCKINGS Wear the elastic stockings on both legs for three weeks following surgery during the day. You may remove them at night  for sleeping.  WEIGHT BEARING Weight bearing as tolerated with assist device (walker, cane, etc) as directed, use it as long as suggested by your surgeon or therapist, typically at least 4-6 weeks.  POSTOPERATIVE CONSTIPATION PROTOCOL Constipation - defined medically as fewer than three stools per week and severe constipation as less than one stool per week.  One of the most common issues patients have following surgery is constipation.  Even if you have a regular bowel pattern at home, your normal regimen is likely to be disrupted due to multiple reasons following surgery.  Combination of anesthesia, postoperative narcotics, change in appetite and fluid intake all can affect your bowels.  In order to avoid complications following surgery, here are some recommendations in order to help you during your recovery period.  . Colace (docusate) - Pick up an over-the-counter form of Colace or another stool softener and take twice a day as long as you are requiring postoperative pain medications.  Take with a full glass of water daily.  If you experience loose stools or diarrhea, hold the colace until you stool forms back up. If your symptoms do not get better within 1 week or if they get worse, check with your doctor. . Dulcolax (bisacodyl) - Pick up over-the-counter and take as directed by the product packaging as needed to assist with the movement of your bowels.  Take with a full glass of water.  Use this product as needed if not relieved by Colace only.  . MiraLax (polyethylene glycol) - Pick up over-the-counter to have on hand. MiraLax is a solution that will increase the amount of water in your bowels to assist with bowel movements.  Take as directed and can mix with a glass of water, juice, soda, coffee, or tea. Take if you go more than two days without a movement. Do not use MiraLax more than once per day. Call your doctor if you are still constipated or irregular after using this medication for 7 days  in a row.  If you continue to have problems with postoperative constipation, please contact the office for further assistance and recommendations.  If you experience "the worst abdominal pain ever" or develop nausea or vomiting, please contact the office immediatly for further recommendations for treatment.  ITCHING If you experience itching with your medications, try taking only a single pain pill, or even half a pain pill at a time.  You can also use Benadryl over the counter for itching or also to help with sleep.   MEDICATIONS See your medication summary on the "After Visit Summary" that the nursing staff will review with you prior to discharge.  You may have some home medications which will be placed on hold until you complete the course of blood thinner medication.  It is important for you to complete the blood thinner medication as prescribed by your surgeon.  Continue your approved medications as instructed at time of discharge.  PRECAUTIONS . If you experience chest pain or shortness of   breath - call 911 immediately for transfer to the hospital emergency department.  . If you develop a fever greater that 101 F, purulent drainage from wound, increased redness or drainage from wound, foul odor from the wound/dressing, or calf pain - CONTACT YOUR SURGEON.                                                   FOLLOW-UP APPOINTMENTS Make sure you keep all of your appointments after your operation with your surgeon and caregivers. You should call the office at the above phone number and make an appointment for approximately two weeks after the date of your surgery or on the date instructed by your surgeon outlined in the "After Visit Summary".  RANGE OF MOTION AND STRENGTHENING EXERCISES  Rehabilitation of the knee is important following a knee injury or an operation. After just a few days of immobilization, the muscles of the thigh which control the knee become weakened and shrink (atrophy). Knee  exercises are designed to build up the tone and strength of the thigh muscles and to improve knee motion. Often times heat used for twenty to thirty minutes before working out will loosen up your tissues and help with improving the range of motion but do not use heat for the first two weeks following surgery. These exercises can be done on a training (exercise) mat, on the floor, on a table or on a bed. Use what ever works the best and is most comfortable for you Knee exercises include:  . Leg Lifts - While your knee is still immobilized in a splint or cast, you can do straight leg raises. Lift the leg to 60 degrees, hold for 3 sec, and slowly lower the leg. Repeat 10-20 times 2-3 times daily. Perform this exercise against resistance later as your knee gets better.  . Quad and Hamstring Sets - Tighten up the muscle on the front of the thigh (Quad) and hold for 5-10 sec. Repeat this 10-20 times hourly. Hamstring sets are done by pushing the foot backward against an object and holding for 5-10 sec. Repeat as with quad sets.   Leg Slides: Lying on your back, slowly slide your foot toward your buttocks, bending your knee up off the floor (only go as far as is comfortable). Then slowly slide your foot back down until your leg is flat on the floor again.  Angel Wings: Lying on your back spread your legs to the side as far apart as you can without causing discomfort.  A rehabilitation program following serious knee injuries can speed recovery and prevent re-injury in the future due to weakened muscles. Contact your doctor or a physical therapist for more information on knee rehabilitation.   IF YOU ARE TRANSFERRED TO A SKILLED REHAB FACILITY If the patient is transferred to a skilled rehab facility following release from the hospital, a list of the current medications will be sent to the facility for the patient to continue.  When discharged from the skilled rehab facility, please have the facility set up the  patient's Home Health Physical Therapy prior to being released. Also, the skilled facility will be responsible for providing the patient with their medications at time of release from the facility to include their pain medication, the muscle relaxants, and their blood thinner medication. If the patient is still at the   rehab facility at time of the two week follow up appointment, the skilled rehab facility will also need to assist the patient in arranging follow up appointment in our office and any transportation needs.  MAKE SURE YOU:  . Understand these instructions.  . Get help right away if you are not doing well or get worse.    Pick up stool softner and laxative for home use following surgery while on pain medications. Do not submerge incision under water. Please use good hand washing techniques while changing dressing each day. May shower starting three days after surgery. Please use a clean towel to pat the incision dry following showers. Continue to use ice for pain and swelling after surgery. Do not use any lotions or creams on the incision until instructed by your surgeon.  

## 2019-12-26 NOTE — Anesthesia Procedure Notes (Signed)
Date/Time: 12/26/2019 10:42 AM Performed by: Florene Route, CRNA Oxygen Delivery Method: Simple face mask

## 2019-12-26 NOTE — Progress Notes (Signed)
AssistedDr. Chelsey Woodrum with right, ultrasound guided, knee, adductor canal block. Side rails up, monitors on throughout procedure. See vital signs in flow sheet. Tolerated Procedure well.  

## 2019-12-26 NOTE — Anesthesia Preprocedure Evaluation (Addendum)
Anesthesia Evaluation  Patient identified by MRN, date of birth, ID band Patient awake    Reviewed: Allergy & Precautions, NPO status , Patient's Chart, lab work & pertinent test results  History of Anesthesia Complications (+) PONV  Airway Mallampati: II  TM Distance: >3 FB Neck ROM: Full    Dental no notable dental hx.    Pulmonary neg pulmonary ROS,    Pulmonary exam normal breath sounds clear to auscultation       Cardiovascular negative cardio ROS Normal cardiovascular exam Rhythm:Regular Rate:Normal     Neuro/Psych negative neurological ROS  negative psych ROS   GI/Hepatic negative GI ROS, Neg liver ROS,   Endo/Other  negative endocrine ROS  Renal/GU negative Renal ROS  negative genitourinary   Musculoskeletal  (+) Arthritis ,   Abdominal   Peds  Hematology negative hematology ROS (+)   Anesthesia Other Findings   Reproductive/Obstetrics                           Anesthesia Physical Anesthesia Plan  ASA: II  Anesthesia Plan: Spinal and Regional   Post-op Pain Management:  Regional for Post-op pain   Induction:   PONV Risk Score and Plan: 3 and Treatment may vary due to age or medical condition, Ondansetron, Dexamethasone and Midazolam  Airway Management Planned: Natural Airway  Additional Equipment:   Intra-op Plan:   Post-operative Plan:   Informed Consent: I have reviewed the patients History and Physical, chart, labs and discussed the procedure including the risks, benefits and alternatives for the proposed anesthesia with the patient or authorized representative who has indicated his/her understanding and acceptance.     Dental advisory given  Plan Discussed with: CRNA  Anesthesia Plan Comments:         Anesthesia Quick Evaluation

## 2019-12-26 NOTE — Interval H&P Note (Signed)
History and Physical Interval Note:  12/26/2019 8:27 AM  Tara Bates  has presented today for surgery, with the diagnosis of right knee osteoarthritis.  The various methods of treatment have been discussed with the patient and family. After consideration of risks, benefits and other options for treatment, the patient has consented to  Procedure(s) with comments: TOTAL KNEE ARTHROPLASTY (Right) - as a surgical intervention.  The patient's history has been reviewed, patient examined, no change in status, stable for surgery.  I have reviewed the patient's chart and labs.  Questions were answered to the patient's satisfaction.     Homero Fellers Jackson Coffield

## 2019-12-26 NOTE — Evaluation (Signed)
Physical Therapy Evaluation Patient Details Name: Tara Bates MRN: [id number] DOB: May 25, 1951 Today's Date: 12/26/2019   History of Present Illness  69 y.o. female admitted 12/26/19 for R TKA. PMH of IBS,  memory loss, urinary incontinence.  Clinical Impression  Pt is s/p TKA resulting in the deficits listed below (see PT Problem List). Pt ambulated 42' with RW, no loss of balance. Initiated TKA HEP. Good progress expected.  Pt will benefit from skilled PT to increase their independence and safety with mobility to allow discharge to the venue listed below.      Follow Up Recommendations Follow surgeon's recommendation for DC plan and follow-up therapies    Equipment Recommendations  Rolling walker with 5" wheels    Recommendations for Other Services       Precautions / Restrictions Precautions Precautions: Knee Precaution Booklet Issued: Yes (comment) Precaution Comments: reviewed no pillow under knee Restrictions Weight Bearing Restrictions: No      Mobility  Bed Mobility Overal bed mobility: Needs Assistance Bed Mobility: Supine to Sit     Supine to sit: Min assist     General bed mobility comments: assist to support RLE  Transfers Overall transfer level: Needs assistance Equipment used: Rolling walker (2 wheeled) Transfers: Sit to/from Stand Sit to Stand: Min assist         General transfer comment: min A to rise, VCs hand placement  Ambulation/Gait Ambulation/Gait assistance: Min guard Gait Distance (Feet): 60 Feet Assistive device: Rolling walker (2 wheeled) Gait Pattern/deviations: Step-to pattern;Decreased stride length Gait velocity: decr   General Gait Details: VCs sequencing, no loss of balance  Stairs            Wheelchair Mobility    Modified Rankin (Stroke Patients Only)       Balance Overall balance assessment: Needs assistance   Sitting balance-Leahy Scale: Good     Standing balance support: Bilateral upper extremity  supported Standing balance-Leahy Scale: Poor Standing balance comment: relies on BUE support                             Pertinent Vitals/Pain Pain Assessment: 0-10 Pain Score: 6  Pain Location: R knee Pain Descriptors / Indicators: Sore Pain Intervention(s): Limited activity within patient's tolerance;Monitored during session;RN gave pain meds during session;Ice applied    Home Living Family/patient expects to be discharged to:: Private residence Living Arrangements: Spouse/significant other Available Help at Discharge: Family;Available 24 hours/day   Home Access: Stairs to enter   Entrance Stairs-Number of Steps: 2 Home Layout: Two level Home Equipment: Walker - 4 wheels;Cane - single point      Prior Function Level of Independence: Independent               Hand Dominance        Extremity/Trunk Assessment   Upper Extremity Assessment Upper Extremity Assessment: Overall WFL for tasks assessed    Lower Extremity Assessment Lower Extremity Assessment: RLE deficits/detail RLE Deficits / Details: SLR -3/5, knee AAROM 5-45*, ankle DF -10* AROM RLE Sensation: WNL RLE Coordination: WNL    Cervical / Trunk Assessment Cervical / Trunk Assessment: Normal  Communication   Communication: No difficulties  Cognition Arousal/Alertness: Awake/alert Behavior During Therapy: WFL for tasks assessed/performed Overall Cognitive Status: Within Functional Limits for tasks assessed  General Comments      Exercises Total Joint Exercises Ankle Circles/Pumps: AROM;Both;10 reps;Supine Quad Sets: AROM;Right;5 reps;Supine Heel Slides: AAROM;Right;10 reps;Supine   Assessment/Plan    PT Assessment Patient needs continued PT services  PT Problem List Decreased strength;Decreased range of motion;Decreased activity tolerance;Decreased balance;Decreased mobility;Pain;Decreased knowledge of use of DME       PT  Treatment Interventions DME instruction;Gait training;Stair training;Functional mobility training;Therapeutic exercise;Therapeutic activities;Patient/family education    PT Goals (Current goals can be found in the Care Plan section)  Acute Rehab PT Goals Patient Stated Goal: play with 2 and 14 year old grandkids PT Goal Formulation: With patient Time For Goal Achievement: 01/02/20 Potential to Achieve Goals: Good    Frequency 7X/week   Barriers to discharge        Co-evaluation               AM-PAC PT "6 Clicks" Mobility  Outcome Measure Help needed turning from your back to your side while in a flat bed without using bedrails?: A Little Help needed moving from lying on your back to sitting on the side of a flat bed without using bedrails?: A Little Help needed moving to and from a bed to a chair (including a wheelchair)?: A Little Help needed standing up from a chair using your arms (e.g., wheelchair or bedside chair)?: A Little Help needed to walk in hospital room?: A Little Help needed climbing 3-5 steps with a railing? : A Lot 6 Click Score: 17    End of Session Equipment Utilized During Treatment: Gait belt Activity Tolerance: Patient tolerated treatment well Patient left: in chair;with call bell/phone within reach;with chair alarm set Nurse Communication: Mobility status PT Visit Diagnosis: Muscle weakness (generalized) (M62.81);Difficulty in walking, not elsewhere classified (R26.2);Pain Pain - Right/Left: Right Pain - part of body: Knee    Time: 4665-9935 PT Time Calculation (min) (ACUTE ONLY): 23 min   Charges:   PT Evaluation $PT Eval Low Complexity: 1 Low PT Treatments $Gait Training: 8-22 mins       Blondell Reveal Kistler PT 12/26/2019  Acute Rehabilitation Services Pager [phone] Office [phone]

## 2019-12-26 NOTE — Anesthesia Procedure Notes (Signed)
Anesthesia Regional Block: Adductor canal block   Pre-Anesthetic Checklist: ,, timeout performed, Correct Patient, Correct Site, Correct Laterality, Correct Procedure, Correct Position, site marked, Risks and benefits discussed,  Surgical consent,  Pre-op evaluation,  At surgeon's request and post-op pain management  Laterality: Right  Prep: Maximum Sterile Barrier Precautions used, chloraprep       Needles:  Injection technique: Single-shot  Needle Type: Echogenic Stimulator Needle     Needle Length: 9cm  Needle Gauge: 22     Additional Needles:   Procedures:,,,, ultrasound used (permanent image in chart),,,,  Narrative:  Start time: 12/26/2019 8:47 AM End time: 12/26/2019 8:57 AM Injection made incrementally with aspirations every 5 mL.  Performed by: Personally  Anesthesiologist: Elmer Picker, MD  Additional Notes: Monitors applied. No increased pain on injection. No increased resistance to injection. Injection made in 5cc increments. Good needle visualization. Patient tolerated procedure well.

## 2019-12-26 NOTE — Anesthesia Procedure Notes (Signed)
Spinal  Patient location during procedure: OR Start time: 12/26/2019 10:44 AM End time: 12/26/2019 10:51 AM Staffing Performed: anesthesiologist and resident/CRNA  Anesthesiologist: Freddrick March, MD Resident/CRNA: Sharlette Dense, CRNA Preanesthetic Checklist Completed: patient identified, IV checked, site marked, risks and benefits discussed, surgical consent, monitors and equipment checked, pre-op evaluation and timeout performed Spinal Block Patient position: sitting Prep: DuraPrep and site prepped and draped Patient monitoring: heart rate, continuous pulse ox and blood pressure Approach: midline Location: L3-4 Injection technique: single-shot Needle Needle type: Sprotte  Needle gauge: 24 G Needle length: 9 cm Additional Notes Kit expiration date 08/11/2020 Clear free flow CSF, negative heme, negative paresthesia Tolerated well after 2 attempts by CRNA and MD,  Returned to supine position

## 2019-12-26 NOTE — Op Note (Signed)
OPERATIVE REPORT-TOTAL KNEE ARTHROPLASTY   Pre-operative diagnosis- Osteoarthritis  Right knee(s)  Post-operative diagnosis- Osteoarthritis Right knee(s)  Procedure-  Right  Total Knee Arthroplasty  Surgeon- Gus Rankin. Vitalia Stough, MD  Assistant- Dennie Bible, PA-C   Anesthesia-  Adductor canal block and spinal  EBL-50 mL   Drains Hemovac  Tourniquet time-  Total Tourniquet Time Documented: Thigh (Right) - 36 minutes Total: Thigh (Right) - 36 minutes     Complications- None  Condition-PACU - hemodynamically stable.   Brief Clinical Note  Tara Bates is a 69 y.o. year old female with end stage OA of her right knee with progressively worsening pain and dysfunction. She has constant pain, with activity and at rest and significant functional deficits with difficulties even with ADLs. She has had extensive non-op management including analgesics, injections of cortisone and viscosupplements, and home exercise program, but remains in significant pain with significant dysfunction.Radiographs show bone on bone arthritis lateral and patellofemoral. She presents now for right Total Knee Arthroplasty.    Procedure in detail---   The patient is brought into the operating room and positioned supine on the operating table. After successful administration of  Adductor canal block and spinal,   a tourniquet is placed high on the  Right thigh(s) and the lower extremity is prepped and draped in the usual sterile fashion. Time out is performed by the operating team and then the  Right lower extremity is wrapped in Esmarch, knee flexed and the tourniquet inflated to 300 mmHg.       A midline incision is made with a ten blade through the subcutaneous tissue to the level of the extensor mechanism. A fresh blade is used to make a medial parapatellar arthrotomy. Soft tissue over the proximal medial tibia is subperiosteally elevated to the joint line with a knife and into the semimembranosus bursa with a  Cobb elevator. Soft tissue over the proximal lateral tibia is elevated with attention being paid to avoiding the patellar tendon on the tibial tubercle. The patella is everted, knee flexed 90 degrees and the ACL and PCL are removed. Findings are bone on bone lateral and patellofemoral with large global osteophytes.        The drill is used to create a starting hole in the distal femur and the canal is thoroughly irrigated with sterile saline to remove the fatty contents. The 5 degree Right  valgus alignment guide is placed into the femoral canal and the distal femoral cutting block is pinned to remove 9 mm off the distal femur. Resection is made with an oscillating saw.      The tibia is subluxed forward and the menisci are removed. The extramedullary alignment guide is placed referencing proximally at the medial aspect of the tibial tubercle and distally along the second metatarsal axis and tibial crest. The block is pinned to remove 105mm off the more deficient lateral  side. Resection is made with an oscillating saw. Size 6is the most appropriate size for the tibia and the proximal tibia is prepared with the modular drill and keel punch for that size.      The femoral sizing guide is placed and size 7 is most appropriate. Rotation is marked off the epicondylar axis and confirmed by creating a rectangular flexion gap at 90 degrees. The size 7 cutting block is pinned in this rotation and the anterior, posterior and chamfer cuts are made with the oscillating saw. The intercondylar block is then placed and that cut is made.  Trial size 6 tibial component, trial size 7 posterior stabilized femur and a 6  mm posterior stabilized rotating platform insert trial is placed. Full extension is achieved with excellent varus/valgus and anterior/posterior balance throughout full range of motion. The patella is everted and thickness measured to be 24  mm. Free hand resection is taken to 14 mm, a 38 template is placed, lug  holes are drilled, trial patella is placed, and it tracks normally. Osteophytes are removed off the posterior femur with the trial in place. All trials are removed and the cut bone surfaces prepared with pulsatile lavage. Cement is mixed and once ready for implantation, the size 6 tibial implant, size  7 posterior stabilized femoral component, and the size 38 patella are cemented in place and the patella is held with the clamp. The trial insert is placed and the knee held in full extension. The Exparel (20 ml mixed with 60 ml saline) is injected into the extensor mechanism, posterior capsule, medial and lateral gutters and subcutaneous tissues.  All extruded cement is removed and once the cement is hard the permanent 6 mm posterior stabilized rotating platform insert is placed into the tibial tray.      The wound is copiously irrigated with saline solution and the extensor mechanism closed over a hemovac drain with #1 V-loc suture. The tourniquet is released for a total tourniquet time of 35  minutes. Flexion against gravity is 140 degrees and the patella tracks normally. Subcutaneous tissue is closed with 2.0 vicryl and subcuticular with running 4.0 Monocryl. The incision is cleaned and dried and steri-strips and a bulky sterile dressing are applied. The limb is placed into a knee immobilizer and the patient is awakened and transported to recovery in stable condition.      Please note that a surgical assistant was a medical necessity for this procedure in order to perform it in a safe and expeditious manner. Surgical assistant was necessary to retract the ligaments and vital neurovascular structures to prevent injury to them and also necessary for proper positioning of the limb to allow for anatomic placement of the prosthesis.   Dione Plover Jaylee Lantry, MD    12/26/2019, 11:58 AM

## 2019-12-27 DIAGNOSIS — M1711 Unilateral primary osteoarthritis, right knee: Secondary | ICD-10-CM | POA: Diagnosis not present

## 2019-12-27 LAB — BASIC METABOLIC PANEL
Anion gap: 8 (ref 5–15)
BUN: 15 mg/dL (ref 8–23)
CO2: 27 mmol/L (ref 22–32)
Calcium: 9 mg/dL (ref 8.9–10.3)
Chloride: 103 mmol/L (ref 98–111)
Creatinine, Ser: 0.74 mg/dL (ref 0.44–1.00)
GFR calc Af Amer: >60 mL/min (ref >60)
GFR calc non Af Amer: >60 mL/min (ref >60)
Glucose, Bld: 147 mg/dL — ABNORMAL HIGH (ref 70–99)
Potassium: 4.7 mmol/L (ref 3.5–5.1)
Sodium: 138 mmol/L (ref 135–145)

## 2019-12-27 LAB — CBC
HCT: 36.3 % (ref 36.0–46.0)
Hemoglobin: 11.4 g/dL — ABNORMAL LOW (ref 12.0–15.0)
MCH: 29.7 pg (ref 26.0–34.0)
MCHC: 31.4 g/dL (ref 30.0–36.0)
MCV: 94.5 fL (ref 80.0–100.0)
Platelets: 268 10*3/uL (ref 150–400)
RBC: 3.84 MIL/uL — ABNORMAL LOW (ref 3.87–5.11)
RDW: 14.6 % (ref 11.5–15.5)
WBC: 11.2 10*3/uL — ABNORMAL HIGH (ref 4.0–10.5)
nRBC: 0 % (ref 0.0–0.2)

## 2019-12-27 MED ORDER — HYDROMORPHONE HCL 2 MG PO TABS: 2.0000 mg | ORAL_TABLET | Freq: Four times a day (QID) | ORAL | 0 refills | Status: DC | PRN

## 2019-12-27 MED ORDER — METHOCARBAMOL 500 MG PO TABS: 500.0000 mg | ORAL_TABLET | Freq: Four times a day (QID) | ORAL | 0 refills | Status: DC | PRN

## 2019-12-27 MED ORDER — TRAMADOL HCL 50 MG PO TABS: 50.0000 mg | ORAL_TABLET | Freq: Four times a day (QID) | ORAL | 0 refills | Status: DC | PRN

## 2019-12-27 MED ORDER — ASPIRIN 325 MG PO TBEC: 325.0000 mg | DELAYED_RELEASE_TABLET | Freq: Two times a day (BID) | ORAL | 0 refills | Status: AC

## 2019-12-27 MED ORDER — SODIUM CHLORIDE 0.9 % IV BOLUS
250.0000 mL | Freq: Once | INTRAVENOUS | Status: AC
  Administered 2019-12-27: 250 mL via INTRAVENOUS

## 2019-12-27 MED ORDER — GABAPENTIN 100 MG PO CAPS: 200.0000 mg | ORAL_CAPSULE | Freq: Three times a day (TID) | ORAL | 0 refills | Status: DC

## 2019-12-27 NOTE — Progress Notes (Signed)
   Subjective: 1 Day Post-Op Procedure(s) (LRB): RIGHT TOTAL KNEE ARTHROPLASTY (Right) Patient reports pain as mild.   Patient seen in rounds by Dr. Lequita Halt. Patient is well, and has had no acute complaints or problems. No issues overnight, states she is ready to go home. Denies chest pain, SOB, or calf pain. Foley catheter to be removed this AM. We will continue therapy today, ambulated 60' yesterday.   Objective: Vital signs in last 24 hours: Temp:  [97.5 F (36.4 C)-98.9 F (37.2 C)] 97.8 F (36.6 C) (03/16 0605) Pulse Rate:  [51-89] 54 (03/16 0605) Resp:  [10-19] 16 (03/16 0605) BP: (102-131)/(55-77) 103/56 (03/16 0605) SpO2:  [98 %-100 %] 100 % (03/16 0605) Weight:  [79.4 kg] 79.4 kg (03/15 1331)  Intake/Output from previous day:  Intake/Output Summary (Last 24 hours) at 12/27/2019 0739 Last data filed at 12/27/2019 0938 Gross per 24 hour  Intake 2932.25 ml  Output 2955 ml  Net -22.75 ml    Labs: Recent Labs    12/27/19 0424  HGB 11.4*   Recent Labs    12/27/19 0424  WBC 11.2*  RBC 3.84*  HCT 36.3  PLT 268   Recent Labs    12/27/19 0424  NA 138  K 4.7  CL 103  CO2 27  BUN 15  CREATININE 0.74  GLUCOSE 147*  CALCIUM 9.0   Exam: General - Patient is Alert and Oriented Extremity - Neurologically intact Neurovascular intact Sensation intact distally Dorsiflexion/Plantar flexion intact Dressing - dressing C/D/I Motor Function - intact, moving foot and toes well on exam.   Past Medical History:  Diagnosis Date  . Anemia    iron deficiency anemia   . Arthritis   . Colon polyps   . IBS (irritable bowel syndrome)   . Iron deficiency   . Memory loss   . PONV (postoperative nausea and vomiting)   . Urinary incontinence   . Urinary tract infection    hx of     Assessment/Plan: 1 Day Post-Op Procedure(s) (LRB): RIGHT TOTAL KNEE ARTHROPLASTY (Right) Active Problems:   OA (osteoarthritis) of knee  Estimated body mass index is 28.69 kg/m as  calculated from the following:   Height as of this encounter: 5' 5.5" (1.664 m).   Weight as of this encounter: 79.4 kg. Advance diet Up with therapy   Patient's anticipated LOS is less than 2 midnights, meeting these requirements: - Younger than 81 - Lives within 1 hour of care - Has a competent adult at home to recover with post-op recover - NO history of  - Chronic pain requiring opiods  - Diabetes  - Coronary Artery Disease  - Heart failure  - Heart attack  - Stroke  - DVT/VTE  - Cardiac arrhythmia  - Respiratory Failure/COPD  - Renal failure  - Anemia  - Advanced Liver disease   DVT Prophylaxis - Aspirin Weight bearing as tolerated. D/C O2 and pulse ox and try on room air. Hemovac pulled without difficulty, will continue therapy today.  Plan is to go Home after hospital stay.  BP soft this AM, 250 mL bolus ordered. Plan for discharge later today if progresses with therapy and is meeting her goals. Scheduled for outpatient physical therapy at [facility] in [place]. Follow-up in 2 weeks.  Arther Abbott, PA-C Orthopedic Surgery [phone] 12/27/2019, 7:39 AM

## 2019-12-27 NOTE — Progress Notes (Signed)
Physical Therapy Treatment Patient Details Name: Tara Bates MRN: [id number] DOB: Mar 03, 1951 Today's Date: 12/27/2019    History of Present Illness 69 y.o. female admitted 12/26/19 for R TKA. PMH of IBS,  memory loss, urinary incontinence.    PT Comments    Pt ambulated again in hallway and practiced safe stair technique with spouse observing.  Pt feels ready for d/c home today.  Provided HEP handout and pt had no further questions.   Follow Up Recommendations  Follow surgeon's recommendation for DC plan and follow-up therapies     Equipment Recommendations  Rolling walker with 5" wheels    Recommendations for Other Services       Precautions / Restrictions Precautions Precautions: Knee Restrictions Weight Bearing Restrictions: No    Mobility  Bed Mobility               General bed mobility comments: pt in recliner  Transfers Overall transfer level: Needs assistance Equipment used: Rolling walker (2 wheeled) Transfers: Sit to/from Stand Sit to Stand: Supervision         General transfer comment: verbal cues for UE and LE positioning  Ambulation/Gait Ambulation/Gait assistance: Min guard;Supervision Gait Distance (Feet): 160 Feet Assistive device: Rolling walker (2 wheeled) Gait Pattern/deviations: Step-to pattern;Decreased stance time - right;Antalgic Gait velocity: decr   General Gait Details: verbal cues for sequence, RW positioning, step length, posture   Stairs Stairs: Yes Stairs assistance: Min guard Stair Management: Step to pattern;Forwards;One rail Right Number of Stairs: 2 General stair comments: verbal cues for sequence and safety, spouse observed, pt and spouse report understanding; pt performed twice   Wheelchair Mobility    Modified Rankin (Stroke Patients Only)       Balance                                            Cognition Arousal/Alertness: Awake/alert Behavior During Therapy: WFL for tasks  assessed/performed Overall Cognitive Status: Within Functional Limits for tasks assessed                                           General Comments        Pertinent Vitals/Pain Pain Assessment: 0-10 Pain Score: 3  Pain Location: R knee Pain Descriptors / Indicators: Sore;Aching Pain Intervention(s): Monitored during session;Repositioned    Home Living                      Prior Function            PT Goals (current goals can now be found in the care plan section) Progress towards PT goals: Progressing toward goals    Frequency    7X/week      PT Plan Current plan remains appropriate    Co-evaluation              AM-PAC PT "6 Clicks" Mobility   Outcome Measure  Help needed turning from your back to your side while in a flat bed without using bedrails?: A Little Help needed moving from lying on your back to sitting on the side of a flat bed without using bedrails?: A Little Help needed moving to and from a bed to a chair (including a wheelchair)?: A Little Help needed standing up from  a chair using your arms (e.g., wheelchair or bedside chair)?: A Little Help needed to walk in hospital room?: A Little Help needed climbing 3-5 steps with a railing? : A Little 6 Click Score: 18    End of Session   Activity Tolerance: Patient tolerated treatment well Patient left: in chair;with call bell/phone within reach;with family/visitor present Nurse Communication: Mobility status PT Visit Diagnosis: Muscle weakness (generalized) (M62.81);Difficulty in walking, not elsewhere classified (R26.2)     Time: 1694-5038 PT Time Calculation (min) (ACUTE ONLY): 9 min  Charges:  $Gait Training: 8-22 mins                    Arlyce Dice, DPT Acute Rehabilitation Services Office: [phone]  York Ram E 12/27/2019, 1:27 PM

## 2019-12-27 NOTE — Progress Notes (Signed)
Physical Therapy Treatment Patient Details Name: Tara Bates MRN: [id number] DOB: [birth year] Today's Date: 12/27/2019    History of Present Illness 69 y.o. female admitted 12/26/19 for R TKA. PMH of IBS,  memory loss, urinary incontinence.    PT Comments    Pt assisted with ambulating in hallway and performed LE exercises.  Pt anticipates d/c home later today however would like to practice steps prior to d/c.   Follow Up Recommendations  Follow surgeon's recommendation for DC plan and follow-up therapies     Equipment Recommendations  Rolling walker with 5" wheels    Recommendations for Other Services       Precautions / Restrictions Precautions Precautions: Knee Restrictions Weight Bearing Restrictions: No    Mobility  Bed Mobility               General bed mobility comments: pt in recliner  Transfers Overall transfer level: Needs assistance Equipment used: Rolling walker (2 wheeled) Transfers: Sit to/from Stand Sit to Stand: Min guard         General transfer comment: verbal cues for UE and LE positioning  Ambulation/Gait Ambulation/Gait assistance: Min guard Gait Distance (Feet): 120 Feet Assistive device: Rolling walker (2 wheeled) Gait Pattern/deviations: Step-to pattern;Decreased stance time - right;Antalgic Gait velocity: decr   General Gait Details: verbal cues for sequence, RW positioning, step length, posture   Stairs             Wheelchair Mobility    Modified Rankin (Stroke Patients Only)       Balance                                            Cognition Arousal/Alertness: Awake/alert Behavior During Therapy: WFL for tasks assessed/performed Overall Cognitive Status: Within Functional Limits for tasks assessed                                        Exercises Total Joint Exercises Ankle Circles/Pumps: AROM;Both;10 reps Quad Sets: AROM;Right;10 reps Short Arc Quad: AROM;Right;10  reps Heel Slides: AAROM;Right;Seated;10 reps Hip ABduction/ADduction: AROM;Right;10 reps Straight Leg Raises: AROM;Right;10 reps Goniometric ROM: approx 90* AAROM right knee    General Comments        Pertinent Vitals/Pain Pain Assessment: 0-10 Pain Score: 4  Pain Location: R knee Pain Descriptors / Indicators: Sore;Aching Pain Intervention(s): Monitored during session;Repositioned;Ice applied    Home Living                      Prior Function            PT Goals (current goals can now be found in the care plan section) Progress towards PT goals: Progressing toward goals    Frequency    7X/week      PT Plan Current plan remains appropriate    Co-evaluation              AM-PAC PT "6 Clicks" Mobility   Outcome Measure  Help needed turning from your back to your side while in a flat bed without using bedrails?: A Little Help needed moving from lying on your back to sitting on the side of a flat bed without using bedrails?: A Little Help needed moving to and from a bed to a chair (including a  wheelchair)?: A Little Help needed standing up from a chair using your arms (e.g., wheelchair or bedside chair)?: A Little Help needed to walk in hospital room?: A Little Help needed climbing 3-5 steps with a railing? : A Little 6 Click Score: 18    End of Session   Activity Tolerance: Patient tolerated treatment well Patient left: in chair;with call bell/phone within reach Nurse Communication: Mobility status PT Visit Diagnosis: Muscle weakness (generalized) (M62.81);Difficulty in walking, not elsewhere classified (R26.2)     Time: 6962-9528 PT Time Calculation (min) (ACUTE ONLY): 14 min  Charges:  $Therapeutic Exercise: 8-22 mins                     Paulino Door, DPT Acute Rehabilitation Services Office: [phone]   [name],[name] 12/27/2019, 11:52 AM

## 2019-12-27 NOTE — TOC Initial Note (Signed)
Transition of Care [facility]) - Initial/Assessment Note    Patient Details  Name: Tara Bates MRN: [id number] Date of Birth: 1951-01-09  [facility] CM/SW Contact:    Armanda Heritage, RN Phone Number: 12/27/2019, 11:47 AM  Clinical Narrative:          Patient set up for outpatient PT. Patient has all dme.          Expected Discharge Plan: Home/Self Care Barriers to Discharge: No Barriers Identified   Patient Goals and CMS Choice Patient states their goals for this hospitalization and ongoing recovery are:: to go home      Expected Discharge Plan and Services Expected Discharge Plan: Home/Self Care   Discharge Planning Services: CM Consult   Living arrangements for the past 2 months: Single Family Home Expected Discharge Date: 12/27/19               DME Arranged: N/A DME Agency: NA       HH Arranged: NA HH Agency: NA        Prior Living Arrangements/Services Living arrangements for the past 2 months: Single Family Home   Patient language and need for interpreter reviewed:: Yes Do you feel safe going back to the place where you live?: Yes      Need for Family Participation in Patient Care: Yes (Comment) Care giver support system in place?: Yes (comment)   Criminal Activity/Legal Involvement Pertinent to Current Situation/Hospitalization: No - Comment as needed  Activities of Daily Living Home Assistive Devices/Equipment: Eyeglasses, Dan Humphreys (specify type) ADL Screening (condition at time of admission) Patient's cognitive ability adequate to safely complete daily activities?: Yes Is the patient deaf or have difficulty hearing?: No Does the patient have difficulty seeing, even when wearing glasses/contacts?: No Does the patient have difficulty concentrating, remembering, or making decisions?: No Patient able to express need for assistance with ADLs?: Yes Does the patient have difficulty dressing or bathing?: No Independently performs ADLs?: Yes  (appropriate for developmental age) Does the patient have difficulty walking or climbing stairs?: Yes Weakness of Legs: Right Weakness of Arms/Hands: None  Permission Sought/Granted                  Emotional Assessment Appearance:: Appears stated age Attitude/Demeanor/Rapport: Engaged Affect (typically observed): Accepting Orientation: : Oriented to Self, Oriented to Place, Oriented to  Time, Oriented to Situation   Psych Involvement: No (comment)  Admission diagnosis:  OA (osteoarthritis) of knee [M17.10] Patient Active Problem List   Diagnosis Date Noted  . OA (osteoarthritis) of knee 12/26/2019  . Encounter for routine gynecological examination 06/23/2016  . Frequent bowel movements 07/01/2012  . Menopause 01/08/2012  . Cystocele 11/24/2011  . Colon polyps    PCP:  Estanislado Pandy, MD Pharmacy:   [facility] - [place], [place] - [address] [place] [address] Phone: [phone] Fax: [phone]  [facility] [address] [place], [place] - [address] [place] [address] Phone: [phone] Fax: [phone]     Social Determinants of Health (SDOH) Interventions    Readmission Risk Interventions No flowsheet data found.

## 2019-12-28 ENCOUNTER — Encounter: Payer: Self-pay | Admitting: *Deleted

## 2020-03-14 DIAGNOSIS — M79671 Pain in right foot: Secondary | ICD-10-CM | POA: Insufficient documentation

## 2020-05-07 ENCOUNTER — Encounter: Payer: Federal, State, Local not specified - PPO | Admitting: Obstetrics & Gynecology

## 2020-05-08 ENCOUNTER — Ambulatory Visit (INDEPENDENT_AMBULATORY_CARE_PROVIDER_SITE_OTHER): Payer: Federal, State, Local not specified - PPO | Admitting: Obstetrics & Gynecology

## 2020-05-08 ENCOUNTER — Encounter: Payer: Self-pay | Admitting: Obstetrics & Gynecology

## 2020-05-08 ENCOUNTER — Other Ambulatory Visit: Payer: Self-pay

## 2020-05-08 VITALS — BP 124/86 | Ht 65.5 in | Wt 172.8 lb

## 2020-05-08 DIAGNOSIS — Z780 Asymptomatic menopausal state: Secondary | ICD-10-CM

## 2020-05-08 DIAGNOSIS — Z90710 Acquired absence of both cervix and uterus: Secondary | ICD-10-CM

## 2020-05-08 DIAGNOSIS — Z01419 Encounter for gynecological examination (general) (routine) without abnormal findings: Secondary | ICD-10-CM

## 2020-05-08 DIAGNOSIS — R300 Dysuria: Secondary | ICD-10-CM

## 2020-05-08 DIAGNOSIS — N3000 Acute cystitis without hematuria: Secondary | ICD-10-CM | POA: Diagnosis not present

## 2020-05-08 MED ORDER — SULFAMETHOXAZOLE-TRIMETHOPRIM 800-160 MG PO TABS: 1.0000 | ORAL_TABLET | Freq: Two times a day (BID) | ORAL | 0 refills | Status: AC

## 2020-05-08 NOTE — Progress Notes (Signed)
RYLYNN KOBS 07-Oct-1951 [id number]   History:    69 y.o. G2P2L2 Married.  4 grand-children.  RP:  Established patient presenting for annual gyn exam   HPI:  S/P Total Hysterectomy.  Postmenopause, well on no HRT.  No pelvic pain.  Rarely sexually active.  Urine/BMs normal.  Breasts normal.  BMI 28.32.  Walking regularly.  Health labs with Fam MD.   Past medical history,surgical history, family history and social history were all reviewed and documented in the EPIC chart.  Gynecologic History No LMP recorded. Patient has had a hysterectomy.  Obstetric History OB History  Gravida Para Term Preterm AB Living  2 2 2     2   SAB TAB Ectopic Multiple Live Births          2    # Outcome Date GA Lbr Len/2nd Weight Sex Delivery Anes PTL Lv  2 Term     M Vag-Spont  N LIV  1 Term     F Vag-Spont  N LIV     ROS: A ROS was performed and pertinent positives and negatives are included in the history.  GENERAL: No fevers or chills. HEENT: No change in vision, no earache, sore throat or sinus congestion. NECK: No pain or stiffness. CARDIOVASCULAR: No chest pain or pressure. No palpitations. PULMONARY: No shortness of breath, cough or wheeze. GASTROINTESTINAL: No abdominal pain, nausea, vomiting or diarrhea, melena or bright red blood per rectum. GENITOURINARY: No urinary frequency, urgency, hesitancy or dysuria. MUSCULOSKELETAL: No joint or muscle pain, no back pain, no recent trauma. DERMATOLOGIC: No rash, no itching, no lesions. ENDOCRINE: No polyuria, polydipsia, no heat or cold intolerance. No recent change in weight. HEMATOLOGICAL: No anemia or easy bruising or bleeding. NEUROLOGIC: No headache, seizures, numbness, tingling or weakness. PSYCHIATRIC: No depression, no loss of interest in normal activity or change in sleep pattern.     Exam:   BP (!) 124/86   Ht 5' 5.5" (1.664 m)   Wt 172 lb 12.8 oz (78.4 kg)   BMI 28.32 kg/m   Body mass index is 28.32 kg/m.  General appearance :  Well developed well nourished female. No acute distress HEENT: Eyes: no retinal hemorrhage or exudates,  Neck supple, trachea midline, no carotid bruits, no thyroidmegaly Lungs: Clear to auscultation, no rhonchi or wheezes, or rib retractions  Heart: Regular rate and rhythm, no murmurs or gallops Breast:Examined in sitting and supine position were symmetrical in appearance, no palpable masses or tenderness,  no skin retraction, no nipple inversion, no nipple discharge, no skin discoloration, no axillary or supraclavicular lymphadenopathy Abdomen: no palpable masses or tenderness, no rebound or guarding Extremities: no edema or skin discoloration or tenderness  Pelvic: Vulva: Normal             Vagina: No gross lesions or discharge  Cervix/Uterus absent  Adnexa  Without masses or tenderness  Anus: Normal  U/A: Orange cloudy, protein negative, nitrites positive, white blood cells 20-40, red blood cells 0-2, many bacteria.  Urine culture pending.   Assessment/Plan:  69 y.o. female for annual exam   1. Encounter for gynecological examination without abnormal finding Gynecologic exam status post total hysterectomy.  No indication to perform a Pap test this year.  Breast exam normal.  Screening mammogram April 2021 was negative.  Colonoscopy 2020.  Health labs with family physician.  2. S/P total hysterectomy  3. Postmenopause Well on no hormone replacement therapy.  Bone density normal in July 2018, will repeat at  5 years.  Vitamin D supplements, calcium intake of 1200 mg daily and regular weightbearing physical activities to continue.  4. Dysuria Urine analysis abnormal compatible with an acute cystitis.  Will treat with Bactrim DS 1 tablet twice a day for 3 days.  Urine culture pending. - Urinalysis,Complete w/RFL Culture  5. Acute cystitis without hematuria As above.  Other orders - sulfamethoxazole-trimethoprim (BACTRIM DS) 800-160 MG tablet; Take 1 tablet by mouth 2 (two) times  daily for 3 days.  Genia Del MD, 3:30 PM 05/08/2020

## 2020-05-11 LAB — URINALYSIS, COMPLETE W/RFL CULTURE
Bilirubin Urine: NEGATIVE
Hyaline Cast: NONE SEEN /LPF
Ketones, ur: NEGATIVE
Nitrites, Initial: POSITIVE — AB
Protein, ur: NEGATIVE
Specific Gravity, Urine: 1.01 (ref 1.001–1.03)
pH: 6 (ref 5.0–8.0)

## 2020-05-11 LAB — URINE CULTURE
MICRO NUMBER:: 10754348
SPECIMEN QUALITY:: ADEQUATE

## 2020-05-11 LAB — CULTURE INDICATED

## 2020-06-21 ENCOUNTER — Ambulatory Visit: Payer: Federal, State, Local not specified - PPO | Attending: Internal Medicine

## 2020-06-21 DIAGNOSIS — Z23 Encounter for immunization: Secondary | ICD-10-CM

## 2020-06-21 NOTE — Progress Notes (Signed)
   Covid-19 Vaccination Clinic  Name:  Tara Bates    MRN: [id number] DOB: 1951/07/04  06/21/2020  Tara Bates was observed post Covid-19 immunization for 15 minutes without incident. She was provided with Vaccine Information Sheet and instruction to access the V-Safe system.   Tara Bates was instructed to call 911 with any severe reactions post vaccine: Marland Kitchen Difficulty breathing  . Swelling of face and throat  . A fast heartbeat  . A bad rash all over body  . Dizziness and weakness

## 2021-04-09 ENCOUNTER — Ambulatory Visit: Payer: Federal, State, Local not specified - PPO | Admitting: Urology

## 2021-04-23 ENCOUNTER — Encounter: Payer: Self-pay | Admitting: Urology

## 2021-04-23 ENCOUNTER — Ambulatory Visit: Payer: Federal, State, Local not specified - PPO | Admitting: Urology

## 2021-04-23 ENCOUNTER — Other Ambulatory Visit: Payer: Self-pay

## 2021-04-23 VITALS — BP 90/61 | HR 94 | Temp 98.4°F | Wt 175.2 lb

## 2021-04-23 DIAGNOSIS — R310 Gross hematuria: Secondary | ICD-10-CM | POA: Diagnosis not present

## 2021-04-23 DIAGNOSIS — N390 Urinary tract infection, site not specified: Secondary | ICD-10-CM | POA: Insufficient documentation

## 2021-04-23 LAB — URINALYSIS, ROUTINE W REFLEX MICROSCOPIC
Bilirubin, UA: NEGATIVE
Glucose, UA: NEGATIVE
Ketones, UA: NEGATIVE
Nitrite, UA: NEGATIVE
Protein,UA: NEGATIVE
RBC, UA: NEGATIVE
Specific Gravity, UA: 1.01 (ref 1.005–1.030)
Urobilinogen, Ur: 0.2 mg/dL (ref 0.2–1.0)
pH, UA: 7 (ref 5.0–7.5)

## 2021-04-23 LAB — MICROSCOPIC EXAMINATION
RBC, Urine: NONE SEEN /hpf (ref 0–2)
Renal Epithel, UA: NONE SEEN /hpf

## 2021-04-23 NOTE — Progress Notes (Signed)
04/23/2021 2:11 PM   Tara Bates Right [birth year] [id number]  Referring provider: Estanislado Pandy, MD [address] [place],  [place] [address]  Recurrent UTI and gross hematuria   HPI: [name] is a 60KD here for evaluation of gross hematuria and recurrent UTI. Over the past 2 years she has had 4-5 UTIs per year. She has a hx of gross painless hematuria in November 2021. She has a hx cystocele repair by Dr. Jerre Simon and then Dr Marcello Fennel with mesh. She brought urine culture results and last culture grew Pseudomonas. UA today shows WBC and bacteria. She has dysuria last night which since resolved.  No hx of tobacco abuse   PMH: Past Medical History:  Diagnosis Date   Anemia    iron deficiency anemia    Arthritis    Colon polyps    IBS (irritable bowel syndrome)    Iron deficiency    Memory loss    PONV (postoperative nausea and vomiting)    Urinary incontinence    Urinary tract infection    hx of     Surgical History: Past Surgical History:  Procedure Laterality Date   BACK SURGERY     LOWER BACK   BLADDER SURGERY     x 2   CHOLECYSTECTOMY  10/28/2012   Procedure: LAPAROSCOPIC CHOLECYSTECTOMY;  Surgeon: Shelly Rubenstein, MD;  Location: [facility];  Service: General;  Laterality: N/A;   KNEE ARTHROSCOPY     left   LAPAROSCOPIC CHOLECYSTECTOMY     REPLACEMENT TOTAL KNEE     LEFT   TOE SURGERY  1994   left small toe   TOTAL KNEE ARTHROPLASTY Right 12/26/2019   Procedure: RIGHT TOTAL KNEE ARTHROPLASTY;  Surgeon: Ollen Gross, MD;  Location: [facility];  Service: Orthopedics;  Laterality: Right;   VAGINAL HYSTERECTOMY     with sling, still has ovaries     Home Medications:  Allergies as of 04/23/2021       Reactions   Codeine Nausea And Vomiting   Macrobid [nitrofurantoin Macrocrystal] Other (See Comments)   Severe headache   Morphine And Related Nausea And Vomiting        Medication List        Accurate as of April 23, 2021  2:11 PM. If you have any questions, ask  your nurse or doctor.          Calcium 600+D 600-800 MG-UNIT Tabs Generic drug: Calcium Carb-Cholecalciferol Take 1 tablet by mouth daily.   FISH OIL + D3 PO Take 2 capsules by mouth daily.   fluticasone 50 MCG/ACT nasal spray Commonly known as: FLONASE Place 3 sprays into both nostrils daily. 1 spray at morning and 2 sprays at night   gabapentin 100 MG capsule Commonly known as: NEURONTIN Take 2 capsules (200 mg total) by mouth 3 (three) times daily. Take 200 mg three times a day for two weeks following surgery.Then take 200 mg two times a day for two weeks. Then take 200 mg once a day for two weeks. Then discontinue.   Glucosamine Chond MSM Formula Tabs Take 1 tablet by mouth daily.   loratadine 10 MG tablet Commonly known as: CLARITIN Take 10 mg by mouth daily.   methocarbamol 500 MG tablet Commonly known as: ROBAXIN Take 1 tablet (500 mg total) by mouth every 6 (six) hours as needed for muscle spasms.   methylcellulose oral powder Take 1 packet by mouth daily.   multivitamin with minerals Tabs tablet Take 1 tablet by  mouth daily.   traMADol 50 MG tablet Commonly known as: ULTRAM Take 1-2 tablets (50-100 mg total) by mouth every 6 (six) hours as needed for moderate pain.   Vitamin D 50 MCG (2000 UT) Caps Take 200 Units by mouth daily.        Allergies:  Allergies  Allergen Reactions   Codeine Nausea And Vomiting   Macrobid [Nitrofurantoin Macrocrystal] Other (See Comments)    Severe headache   Morphine And Related Nausea And Vomiting    Family History: Family History  Problem Relation Age of Onset   Cancer Mother        LUNG   Cancer Father        MESOTHELIOMA   Cancer Sister        MELANOMA    Social History:  reports that she has never smoked. She has never used smokeless tobacco. She reports that she does not drink alcohol and does not use drugs.  ROS: All other review of systems were reviewed and are negative except what is noted above in  HPI  Physical Exam: BP 90/61   Pulse 94   Temp 98.4 F (36.9 C)   Wt 175 lb 3.2 oz (79.5 kg)   BMI 28.71 kg/m   Constitutional:  Alert and oriented, No acute distress. HEENT: [place] AT, moist mucus membranes.  Trachea midline, no masses. Cardiovascular: No clubbing, cyanosis, or edema. Respiratory: Normal respiratory effort, no increased work of breathing. GI: Abdomen is soft, nontender, nondistended, no abdominal masses GU: No CVA tenderness.  Lymph: No cervical or inguinal lymphadenopathy. Skin: No rashes, bruises or suspicious lesions. Neurologic: Grossly intact, no focal deficits, moving all 4 extremities. Psychiatric: Normal mood and affect.  Laboratory Data: Lab Results  Component Value Date   WBC 11.2 (H) 12/27/2019   HGB 11.4 (L) 12/27/2019   HCT 36.3 12/27/2019   MCV 94.5 12/27/2019   PLT 268 12/27/2019    Lab Results  Component Value Date   CREATININE 0.74 12/27/2019    No results found for: PSA  Lab Results  Component Value Date   TESTOSTERONE 39 02/06/2014    No results found for: HGBA1C  Urinalysis    Component Value Date/Time   COLORURINE ORANGE (A) 05/08/2020 1436   APPEARANCEUR CLOUDY (A) 05/08/2020 1436   LABSPEC 1.010 05/08/2020 1436   PHURINE 6.0 05/08/2020 1436   GLUCOSEU TRACE (A) 05/08/2020 1436   HGBUR TRACE (A) 05/08/2020 1436   BILIRUBINUR NEGATIVE 06/23/2016 1443   KETONESUR NEGATIVE 05/08/2020 1436   PROTEINUR NEGATIVE 05/08/2020 1436   UROBILINOGEN 0.2 02/18/2013 1232   NITRITE NEGATIVE 06/23/2016 1443   LEUKOCYTESUR NEGATIVE 06/23/2016 1443    Lab Results  Component Value Date   BACTERIA MANY (A) 05/08/2020    Pertinent Imaging: No results found for this or any previous visit.  No results found for this or any previous visit.  No results found for this or any previous visit.  No results found for this or any previous visit.  No results found for this or any previous visit.  No results found for this or any previous  visit.  No results found for this or any previous visit.  No results found for this or any previous visit.   Assessment & Plan:    1. Recurrent UTI -BMP -CT hematuria -office cystoscopy  - Urinalysis, Routine w reflex microscopic  2. Gross hematuria  -BMP -CT hematuria -office cystoscopy  No follow-ups on file.  Wilkie Aye, MD  [facility]  [place]

## 2021-04-23 NOTE — Patient Instructions (Signed)

## 2021-04-23 NOTE — Progress Notes (Signed)
Urological Symptom Review  Patient is experiencing the following symptoms: Hard to postpone urination Blood in urine Urinary tract infection   Review of Systems  Gastrointestinal (upper)  : Negative for upper GI symptoms  Gastrointestinal (lower) : Negative for lower GI symptoms  Constitutional : Fatigue  Skin: Negative for skin symptoms  Eyes: Negative for eye symptoms  Ear/Nose/Throat : Sinus problems  Hematologic/Lymphatic: Negative for Hematologic/Lymphatic symptoms  Cardiovascular : Negative for cardiovascular symptoms  Respiratory : Negative for respiratory symptoms  Endocrine: Negative for endocrine symptoms  Musculoskeletal: Joint pain  Neurological: Negative for neurological symptoms  Psychologic: Depression Anxiety

## 2021-04-24 LAB — BASIC METABOLIC PANEL
BUN/Creatinine Ratio: 14 (ref 12–28)
BUN: 15 mg/dL (ref 8–27)
CO2: 23 mmol/L (ref 20–29)
Calcium: 9.5 mg/dL (ref 8.7–10.3)
Chloride: 101 mmol/L (ref 96–106)
Creatinine, Ser: 1.05 mg/dL — ABNORMAL HIGH (ref 0.57–1.00)
Glucose: 96 mg/dL (ref 65–99)
Potassium: 4.3 mmol/L (ref 3.5–5.2)
Sodium: 141 mmol/L (ref 134–144)
eGFR: 58 mL/min/{1.73_m2} — ABNORMAL LOW (ref >59)

## 2021-04-25 LAB — URINE CULTURE

## 2021-04-26 ENCOUNTER — Telehealth: Payer: Self-pay

## 2021-04-26 NOTE — Progress Notes (Signed)
Sent via mychart

## 2021-04-26 NOTE — Telephone Encounter (Incomplete Revision)
Pt wants to know if a booster shot will interfere with a CT Scan being done.  CT Scan on 7-27 Booster on 7-19  Please advise.   Call back:   [phone] Judie Petit)   Thanks, Rosey Bath

## 2021-04-26 NOTE — Telephone Encounter (Signed)
Mychart message sent.

## 2021-04-26 NOTE — Telephone Encounter (Signed)
Pt wants to know if a booster shot will interfere with a CT Scan being done.  Please call back to let her know.  Call back:   [phone] (M)

## 2021-05-08 ENCOUNTER — Ambulatory Visit (HOSPITAL_COMMUNITY)
Admission: RE | Admit: 2021-05-08 | Discharge: 2021-05-08 | Disposition: A | Discharge status: Home / Self Care | Payer: Federal, State, Local not specified - PPO | Source: Ambulatory Visit | Attending: Urology | Admitting: Urology

## 2021-05-08 ENCOUNTER — Other Ambulatory Visit: Payer: Self-pay

## 2021-05-08 DIAGNOSIS — R310 Gross hematuria: Secondary | ICD-10-CM | POA: Insufficient documentation

## 2021-05-08 MED ORDER — IOHEXOL 300 MG/ML  SOLN: 25.0000 mL | Freq: Once | INTRAMUSCULAR | Status: DC | PRN

## 2021-05-08 MED ORDER — IOHEXOL 300 MG/ML  SOLN
125.0000 mL | Freq: Once | INTRAMUSCULAR | Status: AC | PRN
  Administered 2021-05-08: 100 mL via INTRAVENOUS

## 2021-05-09 ENCOUNTER — Encounter: Payer: Self-pay | Admitting: Obstetrics & Gynecology

## 2021-05-09 ENCOUNTER — Other Ambulatory Visit: Payer: Self-pay

## 2021-05-09 ENCOUNTER — Ambulatory Visit (INDEPENDENT_AMBULATORY_CARE_PROVIDER_SITE_OTHER): Payer: Federal, State, Local not specified - PPO | Admitting: Obstetrics & Gynecology

## 2021-05-09 VITALS — BP 108/78 | HR 68 | Resp 16 | Ht 65.25 in | Wt 173.0 lb

## 2021-05-09 DIAGNOSIS — E041 Nontoxic single thyroid nodule: Secondary | ICD-10-CM

## 2021-05-09 DIAGNOSIS — Z90710 Acquired absence of both cervix and uterus: Secondary | ICD-10-CM

## 2021-05-09 DIAGNOSIS — Z780 Asymptomatic menopausal state: Secondary | ICD-10-CM | POA: Diagnosis not present

## 2021-05-09 DIAGNOSIS — Z01419 Encounter for gynecological examination (general) (routine) without abnormal findings: Secondary | ICD-10-CM | POA: Diagnosis not present

## 2021-05-09 NOTE — Progress Notes (Signed)
Tara Bates 1951/06/22 [id number]   History:    70 y.o.  G2P2L2 Married.  4 grand-children.   RP:  Established patient presenting for annual gyn exam   HPI:  S/P Total Hysterectomy.  Postmenopause, well on no HRT.  No pelvic pain.  Rarely sexually active.  Urine/BMs normal.  Breasts normal.  BMI 28.32.  Walking less regularly.  C/O irritability, no depressive Sx.  Will increase fitness activities.  Health labs with Fam MD.  Under investigation by Urology:  Renal CT Scan 05/08/2021 pending results.     Past medical history,surgical history, family history and social history were all reviewed and documented in the EPIC chart.  Gynecologic History No LMP recorded. Patient has had a hysterectomy.  Obstetric History OB History  Gravida Para Term Preterm AB Living  2 2 2     2   SAB IAB Ectopic Multiple Live Births          2    # Outcome Date GA Lbr Len/2nd Weight Sex Delivery Anes PTL Lv  2 Term     M Vag-Spont  N LIV  1 Term     F Vag-Spont  N LIV     ROS: A ROS was performed and pertinent positives and negatives are included in the history.  GENERAL: No fevers or chills. HEENT: No change in vision, no earache, sore throat or sinus congestion. NECK: No pain or stiffness. CARDIOVASCULAR: No chest pain or pressure. No palpitations. PULMONARY: No shortness of breath, cough or wheeze. GASTROINTESTINAL: No abdominal pain, nausea, vomiting or diarrhea, melena or bright red blood per rectum. GENITOURINARY: No urinary frequency, urgency, hesitancy or dysuria. MUSCULOSKELETAL: No joint or muscle pain, no back pain, no recent trauma. DERMATOLOGIC: No rash, no itching, no lesions. ENDOCRINE: No polyuria, polydipsia, no heat or cold intolerance. No recent change in weight. HEMATOLOGICAL: No anemia or easy bruising or bleeding. NEUROLOGIC: No headache, seizures, numbness, tingling or weakness. PSYCHIATRIC: No depression, no loss of interest in normal activity or change in sleep pattern.      Exam:   BP 108/78   Pulse 68   Resp 16   Ht 5' 5.25" (1.657 m)   Wt 173 lb (78.5 kg)   BMI 28.57 kg/m   Body mass index is 28.57 kg/m.  General appearance : Well developed well nourished female. No acute distress HEENT: Eyes: no retinal hemorrhage or exudates,  Neck supple, trachea midline, no carotid bruits.  Left thyroid nodule. Lungs: Clear to auscultation, no rhonchi or wheezes, or rib retractions  Heart: Regular rate and rhythm, no murmurs or gallops Breast:Examined in sitting and supine position were symmetrical in appearance, no palpable masses or tenderness,  no skin retraction, no nipple inversion, no nipple discharge, no skin discoloration, no axillary or supraclavicular lymphadenopathy Abdomen: no palpable masses or tenderness, no rebound or guarding Extremities: no edema or skin discoloration or tenderness  Pelvic: Vulva: Normal             Vagina: No gross lesions or discharge  Cervix/Uterus absent  Adnexa  Without masses or tenderness  Anus: Normal   Assessment/Plan:  70 y.o. female for annual exam   1. Well female exam with routine gynecological exam Gynecologic exam status post total hysterectomy.  Breast exam normal.  Screening mammogram April 2022 was negative.  Colonoscopy 2020.  Health labs with family physician.  Body mass index 28.57.  Improve fitness and continue with healthy nutrition.  2. S/P total hysterectomy  3. Postmenopause  Well on no hormone replacement therapy.  Vitamin D supplements, calcium intake of 1.5 g/day total.  Regular weightbearing physical activities.  Bone density normal in 2018.  4. Left thyroid nodule Refer to Endocrinology. - Thyroid Panel With TSH  Other orders - fexofenadine (ALLEGRA) 180 MG tablet; Take 180 mg by mouth daily.   Genia Del MD, 10:22 AM 05/09/2021

## 2021-05-10 LAB — THYROID PANEL WITH TSH
Free Thyroxine Index: 2.2 (ref 1.4–3.8)
T3 Uptake: 26 % (ref 22–35)
T4, Total: 8.3 ug/dL (ref 5.1–11.9)
TSH: 1.44 mIU/L (ref 0.40–4.50)

## 2021-05-13 ENCOUNTER — Encounter: Payer: Self-pay | Admitting: Obstetrics & Gynecology

## 2021-05-13 ENCOUNTER — Telehealth: Payer: Self-pay | Admitting: *Deleted

## 2021-05-13 DIAGNOSIS — E041 Nontoxic single thyroid nodule: Secondary | ICD-10-CM

## 2021-05-13 NOTE — Telephone Encounter (Signed)
-----   Message from Genia Del, MD sent at 05/09/2021 11:39 AM EDT ----- Regarding: Refer to Endocrinologist Left thyroid nodule, smooth, mobile, NT.  Thyroid panel drawn today.

## 2021-05-13 NOTE — Telephone Encounter (Signed)
Patient called and said she would like to see Dr.Balan she is on her Preferred provide list with insurance. Office notes faxed to [phone]. They will call to schedule. Patient aware and number given to patient as well to follow up if she does not hear from this office.

## 2021-05-15 NOTE — Progress Notes (Signed)
Sent via mychart

## 2021-05-16 ENCOUNTER — Other Ambulatory Visit: Payer: Self-pay | Admitting: Endocrinology

## 2021-05-16 DIAGNOSIS — E010 Iodine-deficiency related diffuse (endemic) goiter: Secondary | ICD-10-CM

## 2021-05-20 ENCOUNTER — Encounter: Payer: Self-pay | Admitting: *Deleted

## 2021-05-22 NOTE — Telephone Encounter (Signed)
Patient had appointment with Dr.Balan on 05/13/21 and scheduled for thyroid ultrasound on 05/23/21

## 2021-05-23 ENCOUNTER — Other Ambulatory Visit: Payer: Self-pay

## 2021-05-23 ENCOUNTER — Ambulatory Visit
Admission: RE | Admit: 2021-05-23 | Discharge: 2021-05-23 | Disposition: A | Discharge status: Home / Self Care | Payer: Federal, State, Local not specified - PPO | Source: Ambulatory Visit | Attending: Endocrinology | Admitting: Endocrinology

## 2021-05-23 DIAGNOSIS — E010 Iodine-deficiency related diffuse (endemic) goiter: Secondary | ICD-10-CM

## 2021-05-27 ENCOUNTER — Other Ambulatory Visit: Payer: Federal, State, Local not specified - PPO

## 2021-06-07 ENCOUNTER — Other Ambulatory Visit: Payer: Federal, State, Local not specified - PPO | Admitting: Urology

## 2021-07-22 ENCOUNTER — Other Ambulatory Visit: Payer: Federal, State, Local not specified - PPO | Admitting: Urology

## 2021-08-09 ENCOUNTER — Ambulatory Visit: Payer: Federal, State, Local not specified - PPO | Admitting: Urology

## 2021-08-09 ENCOUNTER — Encounter: Payer: Self-pay | Admitting: Urology

## 2021-08-09 ENCOUNTER — Other Ambulatory Visit: Payer: Self-pay

## 2021-08-09 VITALS — BP 125/67 | HR 70 | Temp 98.4°F

## 2021-08-09 DIAGNOSIS — R310 Gross hematuria: Secondary | ICD-10-CM

## 2021-08-09 LAB — URINALYSIS, ROUTINE W REFLEX MICROSCOPIC
Bilirubin, UA: NEGATIVE
Glucose, UA: NEGATIVE
Ketones, UA: NEGATIVE
Leukocytes,UA: NEGATIVE
Nitrite, UA: NEGATIVE
Protein,UA: NEGATIVE
RBC, UA: NEGATIVE
Specific Gravity, UA: <1.005 — ABNORMAL LOW (ref 1.005–1.030)
Urobilinogen, Ur: 0.2 mg/dL (ref 0.2–1.0)
pH, UA: 5.5 (ref 5.0–7.5)

## 2021-08-09 MED ORDER — CIPROFLOXACIN HCL 500 MG PO TABS
500.0000 mg | ORAL_TABLET | Freq: Once | ORAL | Status: AC
  Administered 2021-08-09: 500 mg via ORAL

## 2021-08-09 NOTE — Patient Instructions (Signed)

## 2021-08-09 NOTE — Progress Notes (Signed)
Urological Symptom Review  Patient is experiencing the following symptoms: Leakage of urine Blood in urine Urinary tract infection   Review of Systems  Gastrointestinal (upper)  : Negative for upper GI symptoms  Gastrointestinal (lower) : Diarrhea  Constitutional : Negative for symptoms  Skin: Negative for skin symptoms  Eyes: Negative for eye symptoms  Ear/Nose/Throat : Sinus problems  Hematologic/Lymphatic: Negative for Hematologic/Lymphatic symptoms  Cardiovascular : Leg swelling  Respiratory : Negative for respiratory symptoms  Endocrine: Negative for endocrine symptoms  Musculoskeletal: Negative for musculoskeletal symptoms  Neurological: Negative for neurological symptoms  Psychologic: Negative for psychiatric symptoms

## 2021-08-09 NOTE — Progress Notes (Signed)
   08/09/21  CC: followup recurrent UTI   HPI: Tara Bates is a 70yo here for followup for recurrent UTI. She was seen by Dr. 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 PVR 140cc. She was started on topical premarin  Blood pressure 125/67, pulse 70, temperature 98.4 F (36.9 C). NED. A&Ox3.   No respiratory distress   Abd soft, NT, ND Normal external genitalia with patent urethral meatus  Cystoscopy Procedure Note  Patient identification was confirmed, informed consent was obtained, and patient was prepped using Betadine solution.  Lidocaine jelly was administered per urethral meatus.    Procedure: - Flexible cystoscope introduced, without any difficulty.   - Thorough search of the bladder revealed:    normal urethral meatus    normal urothelium    no stones    no ulcers     no tumors    no urethral polyps    no trabeculation  - Ureteral orifices were normal in position and appearance.  Post-Procedure: - Patient tolerated the procedure well  Assessment/ Plan: Continue topical premarin 3x per week. RTC 6 months with UA. Patient can drop off a urine specimen for culture if she develops UTI symptoms   No follow-ups on file.  Wilkie Aye, MD

## 2021-12-31 ENCOUNTER — Other Ambulatory Visit: Payer: Self-pay | Admitting: Endocrinology

## 2021-12-31 DIAGNOSIS — E010 Iodine-deficiency related diffuse (endemic) goiter: Secondary | ICD-10-CM

## 2022-01-02 ENCOUNTER — Ambulatory Visit
Admission: RE | Admit: 2022-01-02 | Discharge: 2022-01-02 | Disposition: A | Discharge status: Home / Self Care | Payer: Federal, State, Local not specified - PPO | Source: Ambulatory Visit | Attending: Endocrinology | Admitting: Endocrinology

## 2022-01-02 ENCOUNTER — Other Ambulatory Visit: Payer: Self-pay

## 2022-01-02 DIAGNOSIS — E010 Iodine-deficiency related diffuse (endemic) goiter: Secondary | ICD-10-CM

## 2022-02-07 ENCOUNTER — Ambulatory Visit: Payer: Federal, State, Local not specified - PPO | Admitting: Urology

## 2022-02-10 ENCOUNTER — Encounter: Payer: Self-pay | Admitting: Obstetrics & Gynecology

## 2022-04-01 ENCOUNTER — Other Ambulatory Visit: Payer: Self-pay | Admitting: Endocrinology

## 2022-04-01 DIAGNOSIS — E041 Nontoxic single thyroid nodule: Secondary | ICD-10-CM

## 2022-04-28 ENCOUNTER — Ambulatory Visit: Payer: Federal, State, Local not specified - PPO | Admitting: Family Medicine

## 2022-05-07 DIAGNOSIS — K5790 Diverticulosis of intestine, part unspecified, without perforation or abscess without bleeding: Secondary | ICD-10-CM | POA: Insufficient documentation

## 2022-05-09 ENCOUNTER — Encounter: Payer: Self-pay | Admitting: Family Medicine

## 2022-05-09 ENCOUNTER — Ambulatory Visit: Payer: Federal, State, Local not specified - PPO | Admitting: Family Medicine

## 2022-05-09 VITALS — BP 118/72 | HR 67 | Ht 66.5 in | Wt 177.0 lb

## 2022-05-09 DIAGNOSIS — L659 Nonscarring hair loss, unspecified: Secondary | ICD-10-CM

## 2022-05-09 DIAGNOSIS — K5790 Diverticulosis of intestine, part unspecified, without perforation or abscess without bleeding: Secondary | ICD-10-CM | POA: Diagnosis not present

## 2022-05-09 DIAGNOSIS — R42 Dizziness and giddiness: Secondary | ICD-10-CM | POA: Insufficient documentation

## 2022-05-09 DIAGNOSIS — Z1159 Encounter for screening for other viral diseases: Secondary | ICD-10-CM | POA: Diagnosis not present

## 2022-05-09 DIAGNOSIS — Z23 Encounter for immunization: Secondary | ICD-10-CM | POA: Diagnosis not present

## 2022-05-09 NOTE — Assessment & Plan Note (Signed)
Checking TSH today, was normal 1 year ago

## 2022-05-09 NOTE — Assessment & Plan Note (Addendum)
- ?   If related to cervicogenic dizziness versus compression of vertebral artery with extension of head/neck, happened one time - has not happened since, negative neuro exam today, negative Gilberto Better today - will start with cervical spine X-rays, consider neurology referral, left VM with patient to discuss imaging as this was not discussed at visit due to need to do more research, will call again

## 2022-05-09 NOTE — Assessment & Plan Note (Signed)
Pneumonia vaccine given today.  

## 2022-05-09 NOTE — Progress Notes (Signed)
    SUBJECTIVE:   CHIEF COMPLAINT / HPI:   Tara Bates is a pleasant 43 F who presents for new patient visit.  Concerns today: Would like thyroid checked today, having hair thinning  Episode of dizziness- when she tilted her head back, about a month ago, threw up after it, none since. No neck pain, headaches, vision or hearing changes, numbness, weakness, bowel or bladder inconvenience, radicular symptoms. NO history of trauma.  PMH, surgical history, family and social history reviewed.  Osteoarthritis Cystocele Recurrent UTI Colon polyps  HCM Due for pneumonia vaccine today Never been screened for hepatitis C  PERTINENT  PMH / PSH: osteoarthritis, cystocele, recurrent UTI (follows with urology), colon polyps  OBJECTIVE:   BP 118/72   Pulse 67   Ht 5' 6.5" (1.689 m)   Wt 177 lb (80.3 kg)   SpO2 99%   BMI 28.14 kg/m   General: A&O, NAD HEENT: No sign of trauma, EOM grossly intact, no cervical spine TTP Cardiac: RRR, no m/r/g Respiratory: CTAB, normal WOB, no w/c/r GI: Soft, NTTP, non-distended  Extremities: NTTP, no peripheral edema. Neuro: Normal gait, moves all four extremities appropriately. Psych: Appropriate mood and affect  Neuro: Memory: Intact . PEERLA. Cranial nerves: II through XII are intact. Normal visual fields bilaterally. Sensation normal upper and lower ext's bilaterally. Strength 5/5 in upper and lower ext's bilaterally. Normal position and vibratory sense b/l. FTN, rapid hand alternating movements normal bilaterally. Gait normal. Gilberto Better   ASSESSMENT/PLAN:   Hair loss Checking TSH today, was normal 1 year ago  Need for hepatitis C screening test Checking today  Need for pneumococcal vaccination Pneumonia vaccine given today  Dizziness after extension of neck - ? If related to cervicogenic dizziness versus compression of vertebral artery with extension of head/neck, happened one time - has not happened since, negative neuro exam today,  negative Gilberto Better today - will start with cervical spine X-rays, consider neurology referral, left VM with patient to discuss imaging     Billey Co, MD [facility]

## 2022-05-09 NOTE — Patient Instructions (Addendum)
It was wonderful to see you today.  Please bring ALL of your medications with you to every visit.   Today we talked about:  - We gave your pneumonia shot today! - We are checking your thyroid and screening you for hepatitis C today! - If you have any more episodes of dizziness please give me a call- esp if you have numbness, weakness, room spinning (vertigo), neck pain, headaches, nausea and vomiting, sudden hearing loss, passing out (syncope)- those would be reasons to go to the emergency room! Or    Thank you for choosing [facility].   Please call [phone] with any questions about today's appointment.  Please be sure to schedule follow up at the front  desk before you leave today.   Please arrive at least 15 minutes prior to your scheduled appointments.   If you had blood work today, I will send you a MyChart message or a letter if results are normal. Otherwise, I will give you a call.   If you had a referral placed, they will call you to set up an appointment. Please give [place] a call if you don't hear back in the next 2 weeks.   If you need additional refills before your next appointment, please call your pharmacy first.   Burley Saver, MD  Family Medicine

## 2022-05-09 NOTE — Assessment & Plan Note (Signed)
Checking today.

## 2022-05-10 LAB — HCV AB W REFLEX TO QUANT PCR: HCV Ab: NONREACTIVE

## 2022-05-10 LAB — TSH RFX ON ABNORMAL TO FREE T4: TSH: 1.11 u[IU]/mL (ref 0.450–4.500)

## 2022-05-10 LAB — HCV INTERPRETATION

## 2022-05-13 ENCOUNTER — Encounter: Payer: Self-pay | Admitting: Obstetrics & Gynecology

## 2022-05-13 ENCOUNTER — Ambulatory Visit (INDEPENDENT_AMBULATORY_CARE_PROVIDER_SITE_OTHER): Payer: Federal, State, Local not specified - PPO | Admitting: Obstetrics & Gynecology

## 2022-05-13 ENCOUNTER — Ambulatory Visit: Payer: Federal, State, Local not specified - PPO | Admitting: Obstetrics & Gynecology

## 2022-05-13 ENCOUNTER — Ambulatory Visit (HOSPITAL_COMMUNITY)
Admission: RE | Admit: 2022-05-13 | Discharge: 2022-05-13 | Disposition: A | Discharge status: Home / Self Care | Payer: Federal, State, Local not specified - PPO | Source: Ambulatory Visit | Attending: Family Medicine | Admitting: Family Medicine

## 2022-05-13 VITALS — BP 104/64 | HR 68 | Ht 65.25 in | Wt 176.0 lb

## 2022-05-13 DIAGNOSIS — Z90710 Acquired absence of both cervix and uterus: Secondary | ICD-10-CM

## 2022-05-13 DIAGNOSIS — R42 Dizziness and giddiness: Secondary | ICD-10-CM | POA: Insufficient documentation

## 2022-05-13 DIAGNOSIS — Z780 Asymptomatic menopausal state: Secondary | ICD-10-CM | POA: Diagnosis not present

## 2022-05-13 DIAGNOSIS — Z01419 Encounter for gynecological examination (general) (routine) without abnormal findings: Secondary | ICD-10-CM | POA: Diagnosis not present

## 2022-05-13 DIAGNOSIS — Z13820 Encounter for screening for osteoporosis: Secondary | ICD-10-CM

## 2022-05-13 NOTE — Progress Notes (Signed)
Tara Bates 1951-09-05 [id number]   History:    71 y.o. G2P2L2 Married.  4 grand-children.   RP:  Established patient presenting for annual gyn exam   HPI:  S/P Total Hysterectomy.  Postmenopause, well on no HRT.  No pelvic pain.  Rarely sexually active. Pap Neg 04/2019.  No h/o abnormal Pap.  No indication to repeat a Pap at this time. Urine/BMs normal.  Breasts normal.  Mammo Neg 01/2022.  BMI 29.06.  Walking.  BD 04/2017 Normal.  Will repeat BD this year.  Health labs with Fam MD.  Alen Bleacher 12/2018.   Past medical history,surgical history, family history and social history were all reviewed and documented in the EPIC chart.  Gynecologic History No LMP recorded. Patient has had a hysterectomy.  Obstetric History OB History  Gravida Para Term Preterm AB Living  2 2 2     2   SAB IAB Ectopic Multiple Live Births          2    # Outcome Date GA Lbr Len/2nd Weight Sex Delivery Anes PTL Lv  2 Term     M Vag-Spont  N LIV  1 Term     F Vag-Spont  N LIV     ROS: A ROS was performed and pertinent positives and negatives are included in the history.  GENERAL: No fevers or chills. HEENT: No change in vision, no earache, sore throat or sinus congestion. NECK: No pain or stiffness. CARDIOVASCULAR: No chest pain or pressure. No palpitations. PULMONARY: No shortness of breath, cough or wheeze. GASTROINTESTINAL: No abdominal pain, nausea, vomiting or diarrhea, melena or bright red blood per rectum. GENITOURINARY: No urinary frequency, urgency, hesitancy or dysuria. MUSCULOSKELETAL: No joint or muscle pain, no back pain, no recent trauma. DERMATOLOGIC: No rash, no itching, no lesions. ENDOCRINE: No polyuria, polydipsia, no heat or cold intolerance. No recent change in weight. HEMATOLOGICAL: No anemia or easy bruising or bleeding. NEUROLOGIC: No headache, seizures, numbness, tingling or weakness. PSYCHIATRIC: No depression, no loss of interest in normal activity or change in sleep pattern.      Exam:   BP 104/64   Pulse 68   Ht 5' 5.25" (1.657 m)   Wt 176 lb (79.8 kg)   SpO2 98%   BMI 29.06 kg/m   Body mass index is 29.06 kg/m.  General appearance : Well developed well nourished female. No acute distress HEENT: Eyes: no retinal hemorrhage or exudates,  Neck supple, trachea midline, no carotid bruits, no thyroidmegaly Lungs: Clear to auscultation, no rhonchi or wheezes, or rib retractions  Heart: Regular rate and rhythm, no murmurs or gallops Breast:Examined in sitting and supine position were symmetrical in appearance, no palpable masses or tenderness,  no skin retraction, no nipple inversion, no nipple discharge, no skin discoloration, no axillary or supraclavicular lymphadenopathy Abdomen: no palpable masses or tenderness, no rebound or guarding Extremities: no edema or skin discoloration or tenderness  Pelvic: Vulva: Normal             Vagina: No gross lesions or discharge  Cervix/Uterus absent  Adnexa  Without masses or tenderness  Anus: Normal   Assessment/Plan:  71 y.o. female for annual exam   1. Well female exam with routine gynecological exam S/P Total Hysterectomy.  Postmenopause, well on no HRT.  No pelvic pain.  Rarely sexually active. Pap Neg 04/2019.  No h/o abnormal Pap.  No indication to repeat a Pap at this time. Urine/BMs normal.  Breasts normal.  Mammo Neg  01/2022.  BMI 29.06.  Walking.  BD 04/2017 Normal.  Will repeat BD this year.  Health labs with Fam MD.  Alen Bleacher 12/2018.  2. S/P total hysterectomy  3. Postmenopause Postmenopause, well on no HRT.  No pelvic pain.  Rarely sexually active.  4. Screening for osteoporosis  BMI 29.06.  Walking.  BD 04/2017 Normal.  Will repeat BD this year.  - DG Bone Density; Future  Other orders - loratadine (CLARITIN) 10 MG tablet; Take 10 mg by mouth daily. - Probiotic Product (PROBIOTIC PO); Take by mouth. floragen   Genia Del MD, 2:58 PM 05/13/2022

## 2022-05-14 ENCOUNTER — Telehealth: Payer: Self-pay

## 2022-05-14 NOTE — Telephone Encounter (Signed)
Called patient to discuss. Discussed ultrasound would be best for only carotids (front of neck) not back of neck (vertebrals). Discussed options, she has had no more symptoms, she will call to schedule with Dr. Miquel Dunn to discuss further. Could consider MRI/CTA of neck vs. Neurology referral (patient brought up referral, defer to PCP).  Discussed reasons to call specifically if recurrence of symptoms or any dizziness.   Tara Starr, MD  Family Medicine Teaching Service

## 2022-05-14 NOTE — Telephone Encounter (Signed)
Patient calls nurse line in regards to xray results.   Patient received WNL letter and discussed with her son.   Patient reports he is a [place] and he would like for her to have an US of the neck.   Will forward to PCP for next steps.

## 2022-05-22 ENCOUNTER — Telehealth: Payer: Self-pay | Admitting: Family Medicine

## 2022-05-22 DIAGNOSIS — N390 Urinary tract infection, site not specified: Secondary | ICD-10-CM

## 2022-05-22 DIAGNOSIS — R42 Dizziness and giddiness: Secondary | ICD-10-CM

## 2022-05-22 NOTE — Telephone Encounter (Signed)
Called and discussed with Tara Bates the cervical spine X-ray results. She is feeling well and no further episodes of dizziness. Discussed next steps and will plan for CTA head/neck to check arteries. Will need BMP as we have not checked renal function in over a year. Future order placed, once ordered with get CTA head/neck. Referral to neurology placed per patient request, she also notes symptoms of OSA so can do sleep study with neurology as well.  Return precuations discussed and all questions and concerns answered.  Burley Saver MD

## 2022-05-26 NOTE — Telephone Encounter (Signed)
Patient returns call to nurse line to schedule lab visit. Scheduled for tomorrow at 4:15 pm. Patient wanted to make Dr. Miquel Dunn aware that she has a bone density test on 8/22 and she is unable to have anything with contrast for two weeks prior to having this performed.   Will forward to PCP.   Tara Prude, RN

## 2022-05-27 ENCOUNTER — Other Ambulatory Visit: Payer: Federal, State, Local not specified - PPO

## 2022-05-27 DIAGNOSIS — N390 Urinary tract infection, site not specified: Secondary | ICD-10-CM

## 2022-05-28 ENCOUNTER — Other Ambulatory Visit: Payer: Self-pay | Admitting: Family Medicine

## 2022-05-28 DIAGNOSIS — R42 Dizziness and giddiness: Secondary | ICD-10-CM

## 2022-05-28 LAB — BASIC METABOLIC PANEL
BUN/Creatinine Ratio: 15 (ref 12–28)
BUN: 14 mg/dL (ref 8–27)
CO2: 21 mmol/L (ref 20–29)
Calcium: 9.7 mg/dL (ref 8.7–10.3)
Chloride: 104 mmol/L (ref 96–106)
Creatinine, Ser: 0.92 mg/dL (ref 0.57–1.00)
Glucose: 100 mg/dL — ABNORMAL HIGH (ref 70–99)
Potassium: 4.2 mmol/L (ref 3.5–5.2)
Sodium: 142 mmol/L (ref 134–144)
eGFR: 67 mL/min/{1.73_m2} (ref >59)

## 2022-05-28 NOTE — Progress Notes (Signed)
Ordered CTA head and neck for dizziness with neck extension  Pt needs scheduled anytime after 06/03/22 and prefers [facility], message sent to [facility] today

## 2022-05-29 ENCOUNTER — Other Ambulatory Visit: Payer: Self-pay | Admitting: Family Medicine

## 2022-05-29 ENCOUNTER — Telehealth: Payer: Self-pay

## 2022-05-29 DIAGNOSIS — R42 Dizziness and giddiness: Secondary | ICD-10-CM

## 2022-05-29 NOTE — Telephone Encounter (Signed)
I called Radiology. The order needs to be changed to Angio head and neck. If you want a code stroke she will have to go to the ED. Please advise on what you would like [place] to do.  Sunday Spillers, CMA

## 2022-05-29 NOTE — Telephone Encounter (Signed)
-----   Message from Billey Co, MD sent at 05/28/2022  8:42 AM EDT ----- Regarding: Scheduling CTA head adn neck Hi team,  I have ordered a CTA head and neck for Tara Bates. We got a normal Creatinine so she is ok for contrast, and she can have it scheduled anytime after 06/03/22 (her bone density scan).   Can we please call and schedule this at [facility] if possible (per patient preference) in the next week or so and then call her with date and time of appt?  Thanks, Dr Miquel Dunn

## 2022-05-29 NOTE — Addendum Note (Signed)
Addended by: Burley Saver E on: 05/29/2022 11:22 AM   Modules accepted: Orders

## 2022-06-03 ENCOUNTER — Other Ambulatory Visit: Payer: Self-pay | Admitting: Obstetrics & Gynecology

## 2022-06-03 ENCOUNTER — Ambulatory Visit (INDEPENDENT_AMBULATORY_CARE_PROVIDER_SITE_OTHER): Payer: Federal, State, Local not specified - PPO

## 2022-06-03 DIAGNOSIS — Z13820 Encounter for screening for osteoporosis: Secondary | ICD-10-CM

## 2022-06-03 DIAGNOSIS — Z780 Asymptomatic menopausal state: Secondary | ICD-10-CM

## 2022-06-11 ENCOUNTER — Ambulatory Visit (HOSPITAL_COMMUNITY)
Admission: RE | Admit: 2022-06-11 | Discharge: 2022-06-11 | Disposition: A | Discharge status: Home / Self Care | Payer: Federal, State, Local not specified - PPO | Source: Ambulatory Visit | Attending: Family Medicine | Admitting: Family Medicine

## 2022-06-11 DIAGNOSIS — R42 Dizziness and giddiness: Secondary | ICD-10-CM | POA: Insufficient documentation

## 2022-06-11 MED ORDER — IOHEXOL 350 MG/ML SOLN
60.0000 mL | Freq: Once | INTRAVENOUS | Status: AC | PRN
  Administered 2022-06-11: 60 mL via INTRAVENOUS

## 2022-06-12 ENCOUNTER — Ambulatory Visit: Payer: Federal, State, Local not specified - PPO | Admitting: Neurology

## 2022-06-12 ENCOUNTER — Encounter: Payer: Self-pay | Admitting: Neurology

## 2022-06-12 VITALS — BP 120/77 | HR 74 | Ht 65.2 in | Wt 174.0 lb

## 2022-06-12 DIAGNOSIS — R7401 Elevation of levels of liver transaminase levels: Secondary | ICD-10-CM | POA: Insufficient documentation

## 2022-06-12 DIAGNOSIS — R42 Dizziness and giddiness: Secondary | ICD-10-CM

## 2022-06-12 DIAGNOSIS — K5730 Diverticulosis of large intestine without perforation or abscess without bleeding: Secondary | ICD-10-CM | POA: Insufficient documentation

## 2022-06-12 DIAGNOSIS — E041 Nontoxic single thyroid nodule: Secondary | ICD-10-CM | POA: Insufficient documentation

## 2022-06-12 DIAGNOSIS — M940 Chondrocostal junction syndrome [Tietze]: Secondary | ICD-10-CM | POA: Insufficient documentation

## 2022-06-12 DIAGNOSIS — R140 Abdominal distension (gaseous): Secondary | ICD-10-CM | POA: Insufficient documentation

## 2022-06-12 DIAGNOSIS — D509 Iron deficiency anemia, unspecified: Secondary | ICD-10-CM | POA: Insufficient documentation

## 2022-06-12 DIAGNOSIS — Z800 Family history of malignant neoplasm of digestive organs: Secondary | ICD-10-CM | POA: Insufficient documentation

## 2022-06-12 DIAGNOSIS — Z86010 Personal history of colonic polyps: Secondary | ICD-10-CM | POA: Insufficient documentation

## 2022-06-12 DIAGNOSIS — M25519 Pain in unspecified shoulder: Secondary | ICD-10-CM | POA: Insufficient documentation

## 2022-06-12 DIAGNOSIS — K8020 Calculus of gallbladder without cholecystitis without obstruction: Secondary | ICD-10-CM | POA: Insufficient documentation

## 2022-06-12 DIAGNOSIS — R7301 Impaired fasting glucose: Secondary | ICD-10-CM | POA: Insufficient documentation

## 2022-06-12 NOTE — Patient Instructions (Signed)
Continue to follow-up with PCP regarding lung findings on CTA Return as needed

## 2022-06-12 NOTE — Progress Notes (Signed)
[facility]  PATIENT: Tara Bates DOB: 02-23-1951  REQUESTING CLINICIAN: Billey Co, MD HISTORY FROM: Patient  REASON FOR VISIT: Dizziness    HISTORICAL  CHIEF COMPLAINT:  Chief Complaint  Patient presents with   New Patient (Initial Visit)    Rm 15. Alone. NP internal referral for Dizziness after extension of neck.    HISTORY OF PRESENT ILLNESS:  This is a 71 year old woman with no reported past medical history who is presenting after one episode of of dizziness with extension of her neck.  Patient reports she was at the grocery store with her husband and was looking at items on the top shelves, she looked up and all of a sudden had a room spinning sensation associated with nausea.  Husband helped her to sit down and she let the episode pass.  Since then, she has not had any additional episodes, denies any previous episode of dizziness.  She did follow-up with her primary care doctor who obtained a x-ray of her neck which showed degenerative arthritis but otherwise no acute finding and her CTA neck did not show any large vessel occlusion.  She does not have any other symptoms.     OTHER MEDICAL CONDITIONS: Denies    REVIEW OF SYSTEMS: Full 14 system review of systems performed and negative with exception of: As noted in the HPI   ALLERGIES: Allergies  Allergen Reactions   Bactrim [Sulfamethoxazole-Trimethoprim] Nausea And Vomiting    Bactrim DS   Codeine Nausea And Vomiting   Macrobid [Nitrofurantoin Macrocrystal] Other (See Comments)    Severe headache   Morphine And Related Nausea And Vomiting    HOME MEDICATIONS: Outpatient Medications Prior to Visit  Medication Sig Dispense Refill   Calcium Carb-Cholecalciferol (CALCIUM 600+D) 600-800 MG-UNIT TABS Take 1 tablet by mouth daily.     Cholecalciferol (VITAMIN D) 50 MCG (2000 UT) CAPS Take 2,000 Units by mouth daily.     Fish Oil-Cholecalciferol (FISH OIL + D3 PO) Take 2 capsules by mouth  daily.      fluticasone (FLONASE) 50 MCG/ACT nasal spray Place 3 sprays into both nostrils daily. 1 spray at morning and 2 sprays at night     loratadine (CLARITIN) 10 MG tablet Take 10 mg by mouth daily.     methylcellulose oral powder Take 1 packet by mouth daily.     Multiple Vitamin (MULTIVITAMIN WITH MINERALS) TABS Take 1 tablet by mouth daily.     Probiotic Product (PROBIOTIC PO) Take by mouth. floragen     No facility-administered medications prior to visit.    PAST MEDICAL HISTORY: Past Medical History:  Diagnosis Date   Anemia    iron deficiency anemia    Arthritis    Colon polyps    IBS (irritable bowel syndrome)    Iron deficiency    Memory loss    PONV (postoperative nausea and vomiting)    Urinary incontinence    Urinary tract infection    hx of     PAST SURGICAL HISTORY: Past Surgical History:  Procedure Laterality Date   BACK SURGERY     LOWER BACK   BLADDER SURGERY     x 2   CHOLECYSTECTOMY  10/28/2012   Procedure: LAPAROSCOPIC CHOLECYSTECTOMY;  Surgeon: Shelly Rubenstein, MD;  Location: [facility];  Service: General;  Laterality: N/A;   KNEE ARTHROSCOPY     left   LAPAROSCOPIC CHOLECYSTECTOMY     REPLACEMENT TOTAL KNEE     LEFT   TOE SURGERY  1994   left small toe   TOTAL KNEE ARTHROPLASTY Right 12/26/2019   Procedure: RIGHT TOTAL KNEE ARTHROPLASTY;  Surgeon: Ollen Gross, MD;  Location: [facility];  Service: Orthopedics;  Laterality: Right;   VAGINAL HYSTERECTOMY     with sling, still has ovaries     FAMILY HISTORY: Family History  Problem Relation Age of Onset   Thyroid disease Mother    Cancer Mother        LUNG   Cancer Father        MESOTHELIOMA   Cancer Sister        MELANOMA    SOCIAL HISTORY: Social History   Socioeconomic History   Marital status: Married    Spouse name: Not on file   Number of children: Not on file   Years of education: Not on file   Highest education level: Not on file  Occupational History   Not on file   Tobacco Use   Smoking status: Never   Smokeless tobacco: Never  Vaping Use   Vaping Use: Never used  Substance and Sexual Activity   Alcohol use: No    Alcohol/week: 0.0 standard drinks of alcohol   Drug use: No   Sexual activity: Not Currently    Partners: Male    Birth control/protection: Surgical    Comment: hysterectomy  Other Topics Concern   Not on file  Social History Narrative   Not on file   Social Determinants of Health   Financial Resource Strain: Not on file  Food Insecurity: Not on file  Transportation Needs: Not on file  Physical Activity: Not on file  Stress: Not on file  Social Connections: Not on file  Intimate Partner Violence: Not on file    PHYSICAL EXAM  GENERAL EXAM/CONSTITUTIONAL: Vitals:  Vitals:   06/12/22 0843 06/12/22 0847  BP: 116/76 120/77  Pulse: 64 74  Weight: 174 lb (78.9 kg)   Height: 5' 5.2" (1.656 m)    Body mass index is 28.78 kg/m. Wt Readings from Last 3 Encounters:  06/12/22 174 lb (78.9 kg)  05/13/22 176 lb (79.8 kg)  05/09/22 177 lb (80.3 kg)   Patient is in no distress; well developed, nourished and groomed; neck is supple  EYES: Pupils round and reactive to light, Visual fields full to confrontation, Extraocular movements intacts,   MUSCULOSKELETAL: Gait, strength, tone, movements noted in Neurologic exam below  NEUROLOGIC: MENTAL STATUS:      No data to display         awake, alert, oriented to person, place and time recent and remote memory intact normal attention and concentration language fluent, comprehension intact, naming intact fund of knowledge appropriate  CRANIAL NERVE:  2nd, 3rd, 4th, 6th - pupils equal and reactive to light, visual fields full to confrontation, extraocular muscles intact, no nystagmus 5th - facial sensation symmetric 7th - facial strength symmetric 8th - hearing intact 9th - palate elevates symmetrically, uvula midline 11th - shoulder shrug symmetric 12th - tongue  protrusion midline  MOTOR:  normal bulk and tone, full strength in the BUE, BLE  SENSORY:  normal and symmetric to light touch  COORDINATION:  finger-nose-finger, fine finger movements normal  REFLEXES:  deep tendon reflexes present and symmetric  GAIT/STATION:  normal   DIAGNOSTIC DATA (LABS, IMAGING, TESTING) - I reviewed patient records, labs, notes, testing and imaging myself where available.  Lab Results  Component Value Date   WBC 11.2 (H) 12/27/2019   HGB 11.4 (L) 12/27/2019  HCT 36.3 12/27/2019   MCV 94.5 12/27/2019   PLT 268 12/27/2019      Component Value Date/Time   NA 142 05/27/2022 1702   K 4.2 05/27/2022 1702   CL 104 05/27/2022 1702   CO2 21 05/27/2022 1702   GLUCOSE 100 (H) 05/27/2022 1702   GLUCOSE 147 (H) 12/27/2019 0424   BUN 14 05/27/2022 1702   CREATININE 0.92 05/27/2022 1702   CREATININE 0.80 02/03/2013 1015   CALCIUM 9.7 05/27/2022 1702   PROT 7.0 12/20/2019 0944   ALBUMIN 4.3 12/20/2019 0944   AST 30 12/20/2019 0944   ALT 31 12/20/2019 0944   ALKPHOS 60 12/20/2019 0944   BILITOT 1.1 12/20/2019 0944   GFRNONAA >60 12/27/2019 0424   GFRAA >60 12/27/2019 0424   Lab Results  Component Value Date   CHOL 152 02/03/2013   HDL 67 02/03/2013   LDLCALC 72 02/03/2013   TRIG 63 02/03/2013   CHOLHDL 2.3 02/03/2013   No results found for: "HGBA1C" No results found for: "VITAMINB12" Lab Results  Component Value Date   TSH 1.110 05/09/2022    Cervical spine Xray 05/13/22 Multilevel degenerative changes with disc space narrowing and endplate irregularity No acute findings    CTA Head and Neck 06/11/22 Negative CT head Negative CT angio head and neck    ASSESSMENT AND PLAN  71 y.o. year old female with no reported medical history who is presenting after one episode of dizziness with neck extension. Patient has not had any additional episode of dizziness, she has not tried to extend her neck any further.  So far work-up including  cervical spine x-ray and CTA head and neck is unrevealing except for some findings seen on her lung.  At this time I will recommend patient to continue following up with her PCP regarding her abnormal lung finding and to return as needed. No further neurological work-up indicated at the moment.   1. Dizziness      Patient Instructions  Continue to follow-up with PCP regarding lung findings on CTA Return as needed  No orders of the defined types were placed in this encounter.   No orders of the defined types were placed in this encounter.   Return if symptoms worsen or fail to improve.   I have spent a total of 45 minutes dedicated to this patient today, preparing to see patient, performing a medically appropriate examination and evaluation, ordering tests and/or medications and procedures, and counseling and educating the patient/family/caregiver; independently interpreting result and communicating results to the family/patient/caregiver; and documenting clinical information in the electronic medical record.   Windell Norfolk, MD 06/12/2022, 9:50 AM  [facility] [address], [address] [place], [place] [address] [phone]

## 2022-06-27 ENCOUNTER — Ambulatory Visit (HOSPITAL_COMMUNITY): Payer: Federal, State, Local not specified - PPO

## 2022-11-21 ENCOUNTER — Telehealth: Payer: Self-pay

## 2022-11-21 NOTE — Telephone Encounter (Signed)
Pt calling to report seeing Dr. Collene Mares yesterday for some GI troubles and was advised by her to have her GYN provider schedule her for a pelvic US due to abdominal bloating. Pt has hx of hyst, however, still has ovaries. Exam-WNL @ visit on 05/13/2022.   Spoke w/ pt to gather any additional info. Pt reports having bladder slings placed that have been recalled. Pt reports discomfort is generalized, no more on one side than another. Pt advised ML wont return to office until 11/24/2022, but once we get a confirmation, we will have appt desk contact her. Pt voiced understanding.  Please advise.

## 2022-11-24 NOTE — Telephone Encounter (Signed)
Pt returning call today to f/u re: inquiry.

## 2022-11-28 ENCOUNTER — Other Ambulatory Visit: Payer: Self-pay

## 2022-11-28 DIAGNOSIS — R1031 Right lower quadrant pain: Secondary | ICD-10-CM

## 2022-11-28 NOTE — Telephone Encounter (Signed)
Per ML: "Agree with Pelvic US."  Will send msg to appt desk to contact pt to schedule.

## 2022-11-28 NOTE — Telephone Encounter (Signed)
FYI. Pt scheduled for 12/25/2022. Will route to provider for final review and close encounter.

## 2022-12-25 ENCOUNTER — Encounter: Payer: Self-pay | Admitting: Obstetrics & Gynecology

## 2022-12-25 ENCOUNTER — Ambulatory Visit (INDEPENDENT_AMBULATORY_CARE_PROVIDER_SITE_OTHER): Payer: Federal, State, Local not specified - PPO

## 2022-12-25 ENCOUNTER — Ambulatory Visit (INDEPENDENT_AMBULATORY_CARE_PROVIDER_SITE_OTHER): Payer: Federal, State, Local not specified - PPO | Admitting: Obstetrics & Gynecology

## 2022-12-25 VITALS — BP 110/68 | HR 73

## 2022-12-25 DIAGNOSIS — R1031 Right lower quadrant pain: Secondary | ICD-10-CM

## 2022-12-25 DIAGNOSIS — Z90710 Acquired absence of both cervix and uterus: Secondary | ICD-10-CM | POA: Diagnosis not present

## 2022-12-25 DIAGNOSIS — R1032 Left lower quadrant pain: Secondary | ICD-10-CM

## 2022-12-25 NOTE — Progress Notes (Signed)
    Tara Bates Dec 02, 1950 [id number]        72 y.o.  G2P2002   RP: Abdominal bloating for Pelvic US   HPI: Abdominal bloating and gas with odor.  Followed by Dr Collene Mares, Gertie Fey.  S/P Total Hysterectomy.  Postmenopause, well on no HRT.  No pelvic pain.  Rarely sexually active.     OB History  Gravida Para Term Preterm AB Living  2 2 2     2   SAB IAB Ectopic Multiple Live Births          2    # Outcome Date GA Lbr Len/2nd Weight Sex Delivery Anes PTL Lv  2 Term     M Vag-Spont  N LIV  1 Term     F Vag-Spont  N LIV    Past medical history,surgical history, problem list, medications, allergies, family history and social history were all reviewed and documented in the EPIC chart.   Directed ROS with pertinent positives and negatives documented in the history of present illness/assessment and plan.  Exam:  Vitals:   12/25/22 1436  BP: 110/68  Pulse: 73  SpO2: 99%   General appearance:  Normal  Pelvic US today: T/V images.  Uterus is surgically absent.  Vaginal cuff normal.  Both ovaries are identified and are small with atrophic appearance.  No pelvic mass.  No free fluid seen transabdominally or transvaginally.   Assessment/Plan:  72 y.o. G2P2002   1. Bilateral lower abdominal discomfort Abdominal bloating and gas with odor.  Followed by Dr Collene Mares, Gertie Fey.  S/P Total Hysterectomy.  Postmenopause, well on no HRT.  No pelvic pain.  Rarely sexually active.   Pelvic US findings reviewed with patient.  Bilateral atrophic normal ovaries.  No pelvic mass.  No FF in the pelvis.  Patient reassured.  Will work on her nutrition to prevent constipation and improve her bloating sensation.  May have Gluten intolerance. Nutritional recommendations made.  2. S/P total hysterectomy  Other orders - cetirizine (ZYRTEC ALLERGY) 10 MG tablet; Take 10 mg by mouth daily.   Princess Bruins MD, 2:53 PM 12/25/2022

## 2023-02-18 ENCOUNTER — Encounter: Payer: Self-pay | Admitting: Obstetrics & Gynecology

## 2023-05-20 ENCOUNTER — Other Ambulatory Visit (HOSPITAL_COMMUNITY): Payer: Self-pay | Admitting: Internal Medicine

## 2023-05-20 DIAGNOSIS — E041 Nontoxic single thyroid nodule: Secondary | ICD-10-CM

## 2023-05-22 ENCOUNTER — Ambulatory Visit (HOSPITAL_COMMUNITY)
Admission: RE | Admit: 2023-05-22 | Discharge: 2023-05-22 | Disposition: A | Discharge status: Home / Self Care | Payer: Federal, State, Local not specified - PPO | Source: Ambulatory Visit | Attending: Internal Medicine | Admitting: Internal Medicine

## 2023-05-22 DIAGNOSIS — E041 Nontoxic single thyroid nodule: Secondary | ICD-10-CM

## 2023-06-09 ENCOUNTER — Encounter: Payer: Federal, State, Local not specified - PPO | Admitting: Nurse Practitioner

## 2023-07-01 ENCOUNTER — Encounter (HOSPITAL_COMMUNITY)
Admission: RE | Admit: 2023-07-01 | Discharge: 2023-07-01 | Disposition: A | Discharge status: Home / Self Care | Payer: Federal, State, Local not specified - PPO | Source: Ambulatory Visit | Attending: Ophthalmology | Admitting: Ophthalmology

## 2023-07-01 ENCOUNTER — Other Ambulatory Visit: Payer: Self-pay

## 2023-07-01 ENCOUNTER — Encounter (HOSPITAL_COMMUNITY): Payer: Self-pay

## 2023-07-01 NOTE — H&P (Signed)
Surgical History & Physical  Patient Name: Tara Bates  DOB: August 16, 1951  Surgery: Cataract extraction with intraocular lens implant phacoemulsification; Left Eye Surgeon: [name] MD Surgery Date: 07/06/2023 Pre-Op Date: 05/21/2023  HPI: A 21 Yr. old female patient 1. The patient is here for a cataract evaluation, referred by Dr. Earlene Plater. The patient's vision is blurry. The condition's severity is constant. This is in both eyes, left worse than right. The complaint is associated with difficulty driving at night due to halos/glare, difficulty reading small print on medicine bottles/labels, and difficulty with glare on bright sunny days. This is negatively affecting the patient's quality of life and the patient is unable to function adequately in life with the current level of vision. HPI Completed by Dr. [name]  Medical History: None   Review of Systems Negative Allergic/Immunologic Negative Cardiovascular Negative Constitutional Negative Ear, Nose, Mouth & Throat Negative Endocrine Negative Eyes Negative Gastrointestinal Negative Genitourinary Negative Hemotologic/Lymphatic Negative Integumentary Negative Musculoskeletal Negative Neurological Negative Psychiatry Negative Respiratory  Social Never smoked   Medication None  Sx/Procedures Partial Hysterecomy, Lower back surgery, Bladder surgery, Left Knee Replacement, Gallbladder, Right knee replacement  Drug Allergies  codeine ,  morphine   History & Physical: Heent: cataracts NECK: supple without bruits LUNGS: lungs clear to auscultation CV: regular rate and rhythm Abdomen: soft and non-tender  Impression & Plan: Assessment: 1.  NUCLEAR SCLEROSIS AGE RELATED; Both Eyes (H25.13) 2.  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  BLEPHARITIS; Right Upper Lid, Right Lower Lid, Left Upper Lid, Left Lower Lid (H01.001, H01.002,H01.004,H01.005) 4.  DERMATOCHALASIS, no surgery; Right Upper Lid, Left  Upper Lid (H02.831, H02.834) 5.  ASTIGMATISM, REGULAR; Both Eyes (H52.223)  Plan: 1.  Cataract accounts for the patient's decreased vision. This visual impairment is not correctable with a tolerable change in glasses or contact lenses. Cataract surgery with an implantation of a new lens should significantly improve the visual and functional status of the patient. Discussed all risks, benefits, alternatives, and potential complications. Discussed the procedures and recovery. Patient desires to have surgery. A-scan ordered and performed today for intra-ocular lens calculations. The surgery will be performed in order to improve vision for driving, reading, and for eye examinations. Recommend phacoemulsification with intra-ocular lens. Recommend Dextenza for post-operative pain and inflammation. Left Eye worse - first. Dilates well - shugarcaine by protocol. Toric Lens - Vivity or Eyhance.  2.  Based on cup-to-disc ratio. Negative Family history. OCT rNFL shows: Borderline OU 8/24 IOPs low normal. Detailed discussion about glaucoma today including importance of maintaining good follow up and following treatment plan, and the possibility of irreversible blindness as part of this disease process.  3.  Blepharitis is present - recommend regular lid cleaning.  4.  Asymptomatic, recommend observation for now. Findings, prognosis and treatment options reviewed.  5.  Worse OS - toric OS only.

## 2023-07-06 ENCOUNTER — Encounter (HOSPITAL_COMMUNITY): Payer: Self-pay | Admitting: Ophthalmology

## 2023-07-06 ENCOUNTER — Ambulatory Visit (HOSPITAL_COMMUNITY): Payer: Federal, State, Local not specified - PPO | Admitting: Certified Registered"

## 2023-07-06 ENCOUNTER — Ambulatory Visit (HOSPITAL_COMMUNITY)
Admission: RE | Admit: 2023-07-06 | Discharge: 2023-07-06 | Disposition: A | Discharge status: Home / Self Care | Payer: Federal, State, Local not specified - PPO | Attending: Ophthalmology | Admitting: Ophthalmology

## 2023-07-06 ENCOUNTER — Encounter (HOSPITAL_COMMUNITY)
Admission: RE | Disposition: A | Discharge status: Home / Self Care | Payer: Self-pay | Source: Home / Self Care | Attending: Ophthalmology

## 2023-07-06 DIAGNOSIS — H52223 Regular astigmatism, bilateral: Secondary | ICD-10-CM | POA: Insufficient documentation

## 2023-07-06 DIAGNOSIS — H2512 Age-related nuclear cataract, left eye: Secondary | ICD-10-CM | POA: Insufficient documentation

## 2023-07-06 DIAGNOSIS — H40013 Open angle with borderline findings, low risk, bilateral: Secondary | ICD-10-CM | POA: Diagnosis not present

## 2023-07-06 DIAGNOSIS — H02831 Dermatochalasis of right upper eyelid: Secondary | ICD-10-CM | POA: Insufficient documentation

## 2023-07-06 DIAGNOSIS — Z96653 Presence of artificial knee joint, bilateral: Secondary | ICD-10-CM | POA: Diagnosis not present

## 2023-07-06 DIAGNOSIS — H0100B Unspecified blepharitis left eye, upper and lower eyelids: Secondary | ICD-10-CM | POA: Diagnosis not present

## 2023-07-06 DIAGNOSIS — H02834 Dermatochalasis of left upper eyelid: Secondary | ICD-10-CM | POA: Diagnosis not present

## 2023-07-06 DIAGNOSIS — H0100A Unspecified blepharitis right eye, upper and lower eyelids: Secondary | ICD-10-CM | POA: Insufficient documentation

## 2023-07-06 HISTORY — PX: CATARACT EXTRACTION W/PHACO: SHX586

## 2023-07-06 SURGERY — PHACOEMULSIFICATION, CATARACT, WITH IOL INSERTION
Anesthesia: Monitor Anesthesia Care | Site: Eye | Laterality: Left

## 2023-07-06 MED ORDER — EPINEPHRINE PF 1 MG/ML IJ SOLN
INTRAMUSCULAR | Status: AC
  Filled 2023-07-06: qty 2

## 2023-07-06 MED ORDER — LIDOCAINE HCL 3.5 % OP GEL
1.0000 | Freq: Once | OPHTHALMIC | Status: AC
  Administered 2023-07-06: 1 via OPHTHALMIC

## 2023-07-06 MED ORDER — PHENYLEPHRINE HCL 2.5 % OP SOLN
1.0000 [drp] | OPHTHALMIC | Status: AC | PRN
  Administered 2023-07-06 (×3): 1 [drp] via OPHTHALMIC

## 2023-07-06 MED ORDER — BSS IO SOLN
INTRAOCULAR | Status: DC | PRN
  Administered 2023-07-06: 15 mL via INTRAOCULAR

## 2023-07-06 MED ORDER — SODIUM CHLORIDE 0.9% FLUSH
INTRAVENOUS | Status: DC | PRN
Start: 2023-07-06 — End: 2023-07-06
  Administered 2023-07-06: 3 mL via INTRAVENOUS

## 2023-07-06 MED ORDER — STERILE WATER FOR IRRIGATION IR SOLN
Status: DC | PRN
  Administered 2023-07-06: 250 mL

## 2023-07-06 MED ORDER — MOXIFLOXACIN HCL 5 MG/ML IO SOLN
INTRAOCULAR | Status: DC | PRN
  Administered 2023-07-06: .3 mL via OPHTHALMIC

## 2023-07-06 MED ORDER — SODIUM HYALURONATE 23MG/ML IO SOSY
PREFILLED_SYRINGE | INTRAOCULAR | Status: DC | PRN
  Administered 2023-07-06: .6 mL via INTRAOCULAR

## 2023-07-06 MED ORDER — EPINEPHRINE PF 1 MG/ML IJ SOLN
INTRAOCULAR | Status: DC | PRN
  Administered 2023-07-06: 500 mL

## 2023-07-06 MED ORDER — MOXIFLOXACIN HCL 5 MG/ML IO SOLN
INTRAOCULAR | Status: AC
  Filled 2023-07-06: qty 1

## 2023-07-06 MED ORDER — MIDAZOLAM HCL 2 MG/2ML IJ SOLN
INTRAMUSCULAR | Status: DC | PRN
  Administered 2023-07-06: 2 mg via INTRAVENOUS

## 2023-07-06 MED ORDER — TETRACAINE HCL 0.5 % OP SOLN
1.0000 [drp] | OPHTHALMIC | Status: AC | PRN
  Administered 2023-07-06 (×3): 1 [drp] via OPHTHALMIC

## 2023-07-06 MED ORDER — TROPICAMIDE 1 % OP SOLN
1.0000 [drp] | OPHTHALMIC | Status: AC | PRN
  Administered 2023-07-06 (×3): 1 [drp] via OPHTHALMIC

## 2023-07-06 MED ORDER — SODIUM HYALURONATE 10 MG/ML IO SOLUTION
PREFILLED_SYRINGE | INTRAOCULAR | Status: DC | PRN
  Administered 2023-07-06: .85 mL via INTRAOCULAR

## 2023-07-06 MED ORDER — POVIDONE-IODINE 5 % OP SOLN
OPHTHALMIC | Status: DC | PRN
  Administered 2023-07-06: 1 via OPHTHALMIC

## 2023-07-06 MED ORDER — MIDAZOLAM HCL 2 MG/2ML IJ SOLN
INTRAMUSCULAR | Status: AC
  Filled 2023-07-06: qty 2

## 2023-07-06 MED ORDER — LIDOCAINE HCL (PF) 1 % IJ SOLN
INTRAOCULAR | Status: DC | PRN
  Administered 2023-07-06: 1 mL via OPHTHALMIC

## 2023-07-06 SURGICAL SUPPLY — 14 items
CATARACT SUITE SIGHTPATH (MISCELLANEOUS) ×1
CLOTH BEACON ORANGE TIMEOUT ST (SAFETY) ×1 IMPLANT
EYE SHIELD UNIVERSAL CLEAR (GAUZE/BANDAGES/DRESSINGS) IMPLANT
FEE CATARACT SUITE SIGHTPATH (MISCELLANEOUS) ×1 IMPLANT
GLOVE BIOGEL PI IND STRL 7.0 (GLOVE) ×2 IMPLANT
LENS IOL EYHANCE TRC 150 19.0 IMPLANT
LENS IOL TORIC DIU150 19.0 ×1 IMPLANT
NDL HYPO 18GX1.5 BLUNT FILL (NEEDLE) ×1 IMPLANT
NEEDLE HYPO 18GX1.5 BLUNT FILL (NEEDLE) ×1
PAD ARMBOARD 7.5X6 YLW CONV (MISCELLANEOUS) ×1 IMPLANT
POSITIONER HEAD 8X9X4 ADT (SOFTGOODS) ×1 IMPLANT
SYR TB 1ML LL NO SAFETY (SYRINGE) ×1 IMPLANT
TAPE SURG TRANSPORE 1 IN (GAUZE/BANDAGES/DRESSINGS) IMPLANT
WATER STERILE IRR 250ML POUR (IV SOLUTION) ×1 IMPLANT

## 2023-07-06 NOTE — Discharge Instructions (Signed)
Please discharge patient when stable, will follow up today with Dr. Carnella Fryman at the [facility] office immediately following discharge.  Leave shield in place until visit.  All paperwork with discharge instructions will be given at the office.  [facility] Address:  [address]  [place], [place] [address]  

## 2023-07-06 NOTE — Transfer of Care (Signed)
Immediate Anesthesia Transfer of Care Note  Patient: Tara Bates  Procedure(s) Performed: CATARACT EXTRACTION PHACO AND INTRAOCULAR LENS PLACEMENT (IOC) (Left: Eye)  Patient Location: Short Stay  Anesthesia Type:MAC  Level of Consciousness: awake, alert , oriented, and patient cooperative  Airway & Oxygen Therapy: Patient Spontanous Breathing  Post-op Assessment: Report given to RN, Post -op Vital signs reviewed and stable, and Patient moving all extremities X 4  Post vital signs: Reviewed and stable  Last Vitals:  Vitals Value Taken Time  BP    Temp    Pulse    Resp    SpO2      Last Pain:  Vitals:   07/06/23 1000  TempSrc: Oral  PainSc: 0-No pain         Complications: No notable events documented.

## 2023-07-06 NOTE — Anesthesia Preprocedure Evaluation (Addendum)
Anesthesia Evaluation  Patient identified by MRN, date of birth, ID band Patient awake    Reviewed: Allergy & Precautions, H&P , NPO status , Patient's Chart, lab work & pertinent test results, reviewed documented beta blocker date and time   History of Anesthesia Complications (+) PONV and history of anesthetic complications  Airway Mallampati: II  TM Distance: >3 FB Neck ROM: full    Dental no notable dental hx.    Pulmonary neg pulmonary ROS   Pulmonary exam normal breath sounds clear to auscultation       Cardiovascular Exercise Tolerance: Good negative cardio ROS  Rhythm:regular Rate:Normal     Neuro/Psych negative neurological ROS  negative psych ROS   GI/Hepatic negative GI ROS, Neg liver ROS,,,  Endo/Other  negative endocrine ROS    Renal/GU negative Renal ROS  negative genitourinary   Musculoskeletal   Abdominal   Peds  Hematology negative hematology ROS (+) Blood dyscrasia, anemia   Anesthesia Other Findings   Reproductive/Obstetrics negative OB ROS                             Anesthesia Physical Anesthesia Plan  ASA: 2  Anesthesia Plan: MAC   Post-op Pain Management:    Induction:   PONV Risk Score and Plan:   Airway Management Planned:   Additional Equipment:   Intra-op Plan:   Post-operative Plan:   Informed Consent: I have reviewed the patients History and Physical, chart, labs and discussed the procedure including the risks, benefits and alternatives for the proposed anesthesia with the patient or authorized representative who has indicated his/her understanding and acceptance.     Dental Advisory Given  Plan Discussed with: CRNA  Anesthesia Plan Comments:        Anesthesia Quick Evaluation

## 2023-07-06 NOTE — Op Note (Signed)
Date of procedure: 07/06/23  Pre-operative diagnosis: Visually significant age-related nuclear cataract, Left Eye; Visually Significant Astigmatism, Left Eye (H25.12)  Post-operative diagnosis: Visually significant age-related nuclear cataract, Left Eye; Visually Significant Astigmatism, Left Eye  Procedure: Removal of cataract via phacoemulsification and insertion of intra-ocular lens Laural Benes and Johnson DIU150 +19.0D into the capsular bag of the Left Eye  Attending surgeon: Rudy Jew. Mell Guia, MD, MA  Anesthesia: MAC, Topical Akten  Complications: None  Estimated Blood Loss: <38mL (minimal)  Specimens: None  Implants: As above  Indications:  Visually significant age-related cataract, Left Eye; Visually Significant Astigmatism, Left Eye  Procedure:  The patient was seen and identified in the pre-operative area. The operative eye was identified and dilated.  The operative eye was marked.  Pre-operative toric markers were used to mark the eye at 0 and 180 degrees. Topical anesthesia was administered to the operative eye.     The patient was then to the operative suite and placed in the supine position.  A timeout was performed confirming the patient, procedure to be performed, and all other relevant information.   The patient's face was prepped and draped in the usual fashion for intra-ocular surgery.  A lid speculum was placed into the operative eye and the surgical microscope moved into place and focused.  A superotemporal paracentesis was created using a 20 gauge paracentesis blade.  Shugarcaine was injected into the anterior chamber.  Viscoelastic was injected into the anterior chamber.  A temporal clear-corneal main wound incision was created using a 2.46mm microkeratome.  A continuous curvilinear capsulorrhexis was initiated using an irrigating cystitome and completed using capsulorrhexis forceps.  Hydrodissection and hydrodeliniation were performed.  Viscoelastic was injected into the  anterior chamber.  A phacoemulsification handpiece and a chopper as a second instrument were used to remove the nucleus and epinucleus. The irrigation/aspiration handpiece was used to remove any remaining cortical material.   The capsular bag was reinflated with viscoelastic, checked, and found to be intact.  The eye was marked to the per-op meridian.  The intraocular lens was inserted into the capsular bag and dialed into place using a Kuglen hook to 147 degrees.  The irrigation/aspiration handpiece was used to remove any remaining viscoelastic.  The clear corneal wound and paracentesis wounds were then hydrated and checked with Weck-Cels to be watertight. 0.29mL of moxifloxacin was injected into the anterior chamber. The lid-speculum and drape was removed, and the patient's face was cleaned with a wet and dry 4x4.   A clear shield was taped over the eye. The patient was taken to the post-operative care unit in good condition, having tolerated the procedure well.  Post-Op Instructions: The patient will follow up at [facility] for a same day post-operative evaluation and will receive all other orders and instructions.

## 2023-07-06 NOTE — Interval H&P Note (Signed)
History and Physical Interval Note:  07/06/2023 11:53 AM  Tara Bates  has presented today for surgery, with the diagnosis of age related nuclear cataract, left eye.  The various methods of treatment have been discussed with the patient and family. After consideration of risks, benefits and other options for treatment, the patient has consented to  Procedure(s): CATARACT EXTRACTION PHACO AND INTRAOCULAR LENS PLACEMENT (IOC) (Left) as a surgical intervention.  The patient's history has been reviewed, patient examined, no change in status, stable for surgery.  I have reviewed the patient's chart and labs.  Questions were answered to the patient's satisfaction.     [name]

## 2023-07-10 ENCOUNTER — Encounter (HOSPITAL_COMMUNITY): Payer: Federal, State, Local not specified - PPO

## 2023-07-13 NOTE — Anesthesia Postprocedure Evaluation (Signed)
Anesthesia Post Note  Patient: Tara Bates  Procedure(s) Performed: CATARACT EXTRACTION PHACO AND INTRAOCULAR LENS PLACEMENT (IOC) (Left: Eye)  Patient location during evaluation: Phase II Anesthesia Type: MAC Level of consciousness: awake Pain management: pain level controlled Vital Signs Assessment: post-procedure vital signs reviewed and stable Respiratory status: spontaneous breathing and respiratory function stable Cardiovascular status: blood pressure returned to baseline and stable Postop Assessment: no headache and no apparent nausea or vomiting Anesthetic complications: no Comments: Late entry   No notable events documented.   Last Vitals:  Vitals:   07/06/23 1000 07/06/23 1220  BP: 107/64 119/63  Pulse: 60 (!) 50  Resp: 15 12  Temp: 36.8 C 36.4 C  SpO2: 100% 100%    Last Pain:  Vitals:   07/06/23 1220  TempSrc: Oral  PainSc: 0-No pain                 Windell Norfolk

## 2023-07-14 ENCOUNTER — Encounter (HOSPITAL_COMMUNITY): Payer: Self-pay | Admitting: Ophthalmology

## 2023-07-21 ENCOUNTER — Encounter (HOSPITAL_COMMUNITY)
Admission: RE | Admit: 2023-07-21 | Discharge: 2023-07-21 | Disposition: A | Discharge status: Home / Self Care | Payer: Federal, State, Local not specified - PPO | Source: Ambulatory Visit | Attending: Ophthalmology | Admitting: Ophthalmology

## 2023-07-21 ENCOUNTER — Encounter (HOSPITAL_COMMUNITY): Payer: Self-pay

## 2023-07-21 NOTE — H&P (Signed)
Surgical History & Physical  Patient Name: Tara Bates  DOB: 1951-07-15  Surgery: Cataract extraction with intraocular lens implant phacoemulsification; Right Eye Surgeon: [name] MD Surgery Date: 07/27/2023 Pre-Op Date: 07/09/2023  HPI: A 4 Yr. old female patient 1. The patient is returning after cataract surgery. The left eye is affected. Status post cataract surgery, which began 3 days ago: Since the last visit, the affected area is doing well. The patient's vision is improved. Patient is following medication instructions. 2. The patient is returning for a cataract follow-up of the right eye. Since the last visit, the affected area is tolerating. The patient's vision is blurry. The condition's severity is constant. The complaint is associated with difficulty reading small print on medicine bottles/labels, difficulty driving at night due to halos/glare, and poor night vision. This is negatively affecting the patient's quality of life and the patient is unable to function adequately in life with the current level of vision. HPI Completed by Dr. [name]  Medical History: None  Negative Allergic/Immunologic Negative Cardiovascular Negative Constitutional Negative Ear, Nose, Mouth & Throat Negative Endocrine Negative Eyes Negative Gastrointestinal Negative Genitourinary Negative Hemotologic/Lymphatic Negative Integumentary Negative Musculoskeletal Negative Neurological Negative Psychiatry Negative Respiratory  Social Never smoked   Medication Prednisolone-moxiflox-bromfen,   Sx/Procedures Phaco c IOL OS - toric,  Partial Hysterecomy, Lower back surgery, Bladder surgery, Left Knee Replacement, Gallbladder, Right knee replacement  Drug Allergies  codeine ,  morphine   History & Physical: Heent: cataract NECK: supple without bruits LUNGS: lungs clear to auscultation CV: regular rate and rhythm Abdomen: soft and non-tender  Impression &  Plan: Assessment: 1.  CATARACT EXTRACTION STATUS; Left Eye (Z98.42) 2.  NUCLEAR SCLEROSIS AGE RELATED; , Right Eye (H25.11) 3.  INTRAOCULAR LENS IOL ; Left Eye (Z96.1)  Plan: 1.  3 days after cataract surgery. Doing well with improved vision and normal eye pressure. Call with any problems or concerns. Continue Pred-Moxi-Brom 3x/day for 4 more days and then 2x/day for 3 more weeks.  2.  Cataract accounts for the patient's decreased vision. This visual impairment is not correctable with a tolerable change in glasses or contact lenses. Cataract surgery with an implantation of a new lens should significantly improve the visual and functional status of the patient. Discussed all risks, benefits, alternatives, and potential complications. Discussed the procedures and recovery. Patient desires to have surgery. A-scan ordered and performed today for intra-ocular lens calculations. The surgery will be performed in order to improve vision for driving, reading, and for eye examinations. Recommend phacoemulsification with intra-ocular lens. Recommend Dextenza for post-operative pain and inflammation. Right Eye. Surgery required to correct imbalance of vision. Dilates well - shugarcaine by protocol.  3.  Doing well since surgery Continue Post-op medications

## 2023-07-22 IMAGING — CT CT ABD-PEL WO/W CM
4 of 12 series · 12 of 46 positions shown, 18 images · IV contrast (omnipaque)
Comparison: December 31, 2006.

CLINICAL DATA: gross hematuria, history of chronic UTIs and
hematuria with bladder mesh surgery x2

EXAM:
CT ABDOMEN AND PELVIS WITHOUT AND WITH CONTRAST
TECHNIQUE: Multidetector CT imaging of the abdomen and pelvis was performed
following the standard protocol before and following the bolus
administration of intravenous contrast.
CONTRAST:  944cc of Omnipaque 300

[Series 2: axial pre · axial · non-contrast · 0.87mm/px · z∈[+1101,+1376]mm · 4 of 93 slices shown, 9 images]
[im 19/93  soft-tissue]
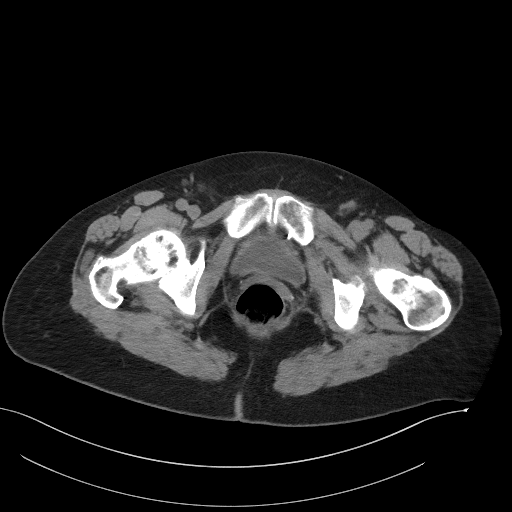
[im 19/93  lung]
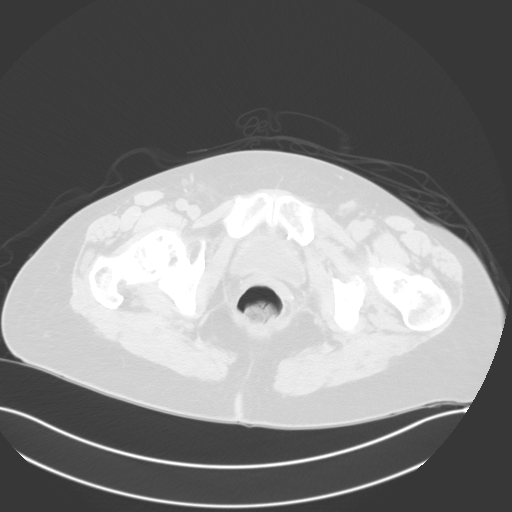
[im 19/93  bone]
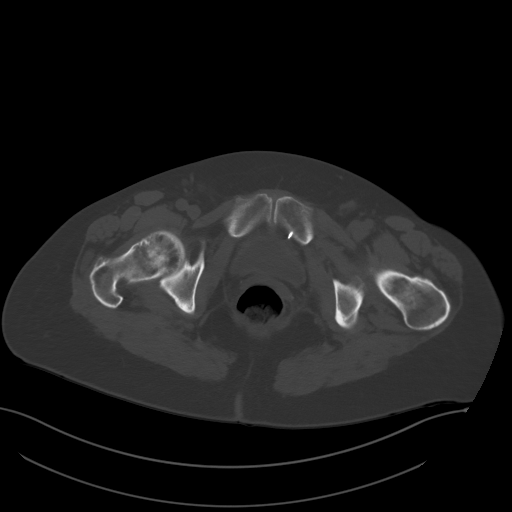
[im 37/93  soft-tissue]
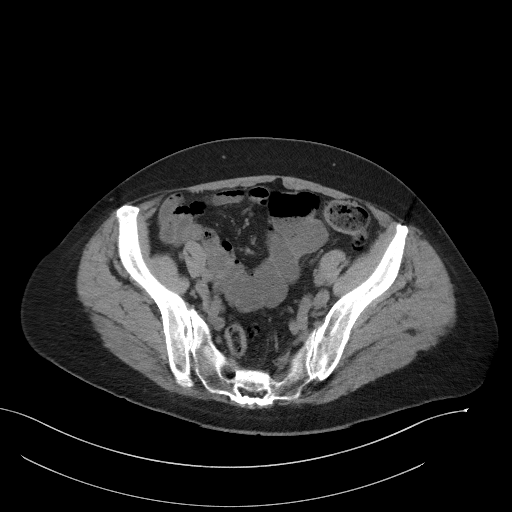
[im 37/93  lung]
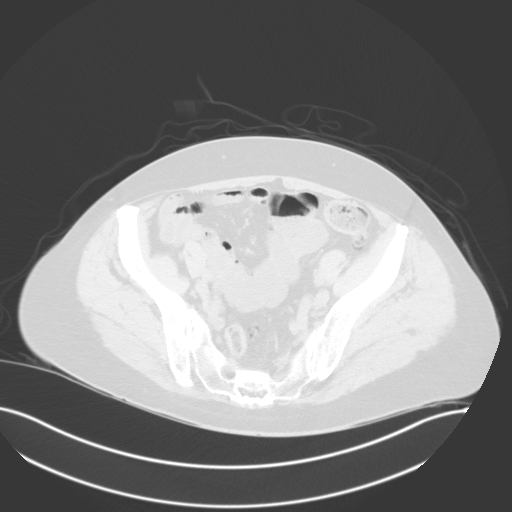
[im 56/93  soft-tissue]
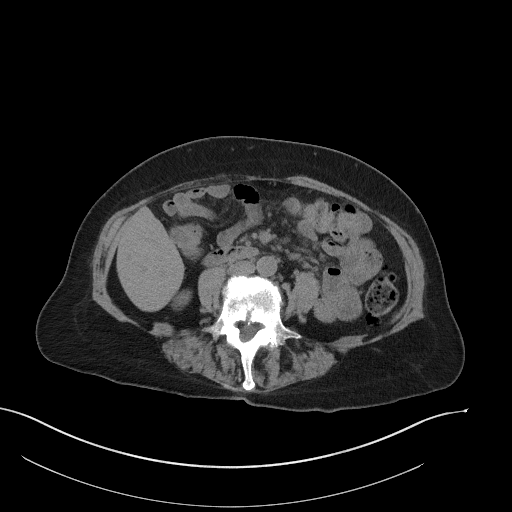
[im 56/93  lung]
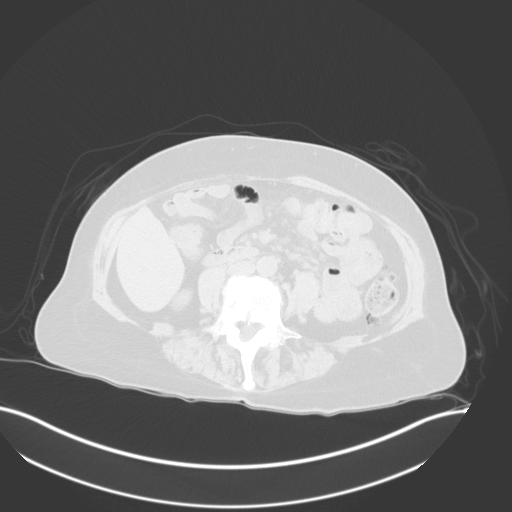
[im 74/93  soft-tissue]
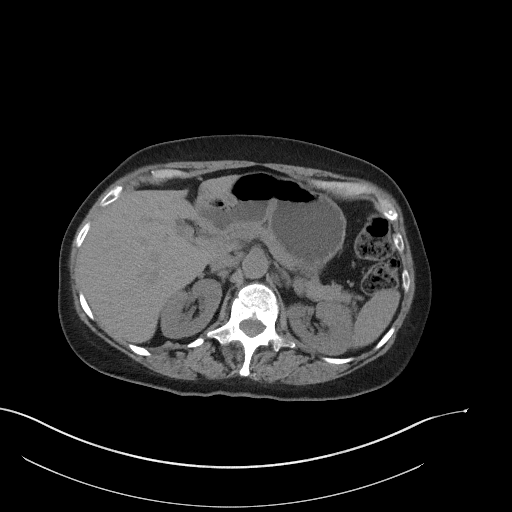
[im 74/93  lung]
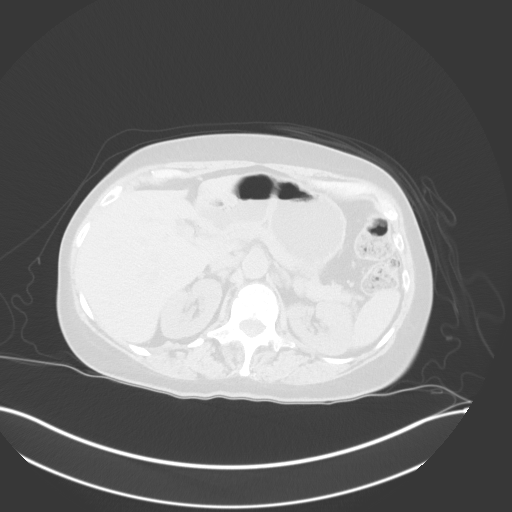

[Series 5: coronal pre · coronal · non-contrast · 0.86mm/px · 2 of 95 slices shown, 3 images]
[im 32/95  soft-tissue]
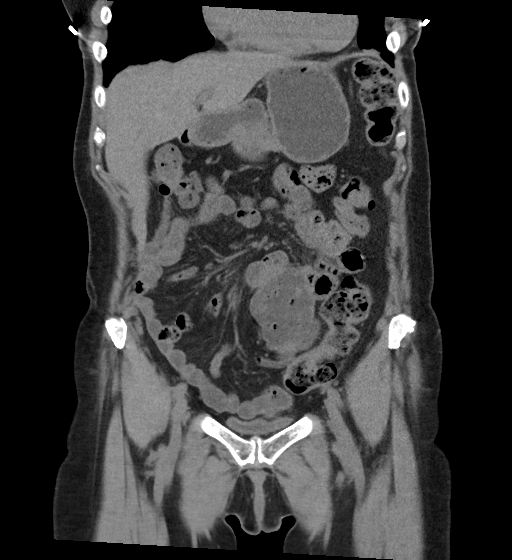
[im 32/95  bone]
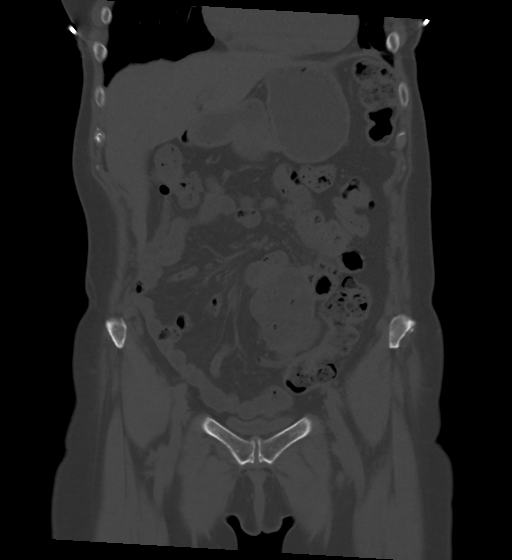
[im 63/95  soft-tissue]
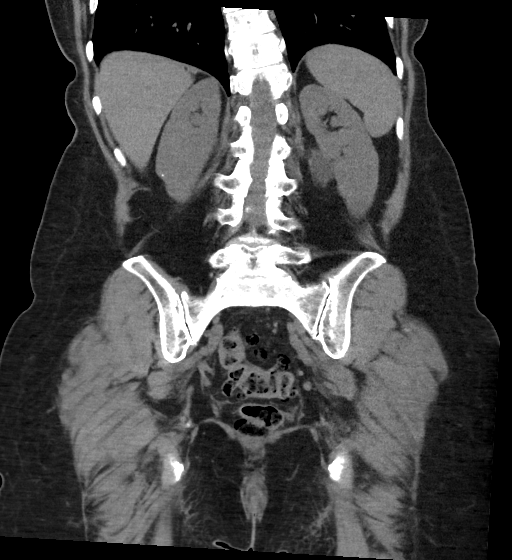

[Series 7: axial post · axial · 0.87mm/px · z∈[+1101,+1376]mm · 4 of 93 slices shown]
[im 19/93  soft-tissue]
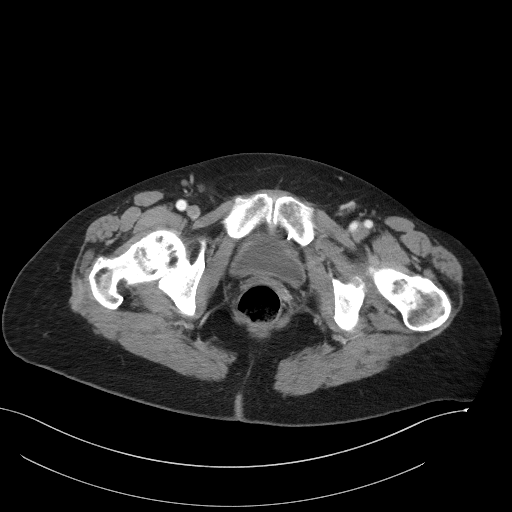
[im 37/93  soft-tissue]
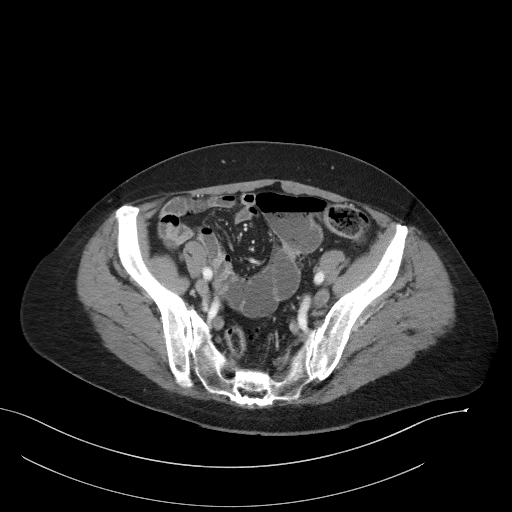
[im 56/93  soft-tissue]
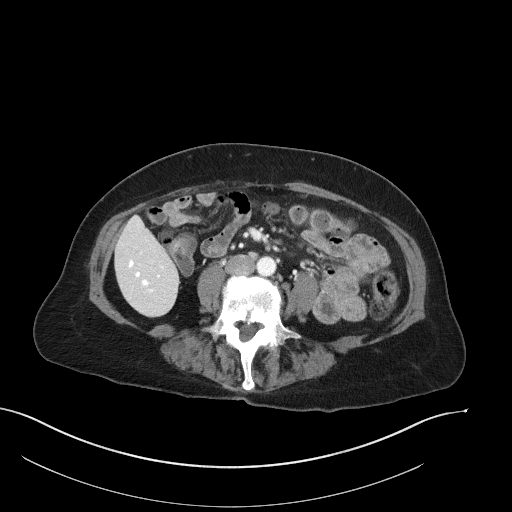
[im 74/93  soft-tissue]
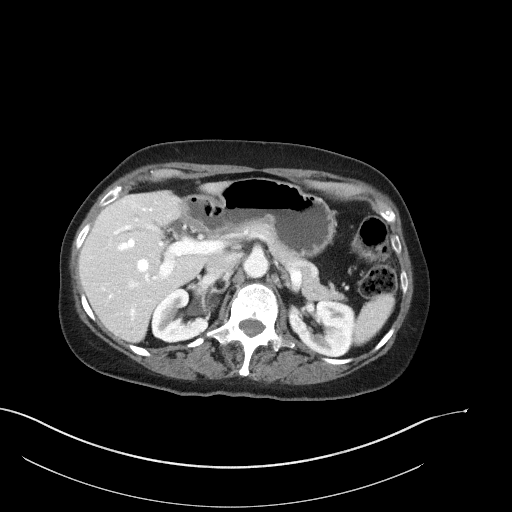

[Series 13: axial delay · axial · delayed · 0.79mm/px · z∈[+1185,+1300]mm · 2 of 91 slices shown]
[im 23/91  soft-tissue]
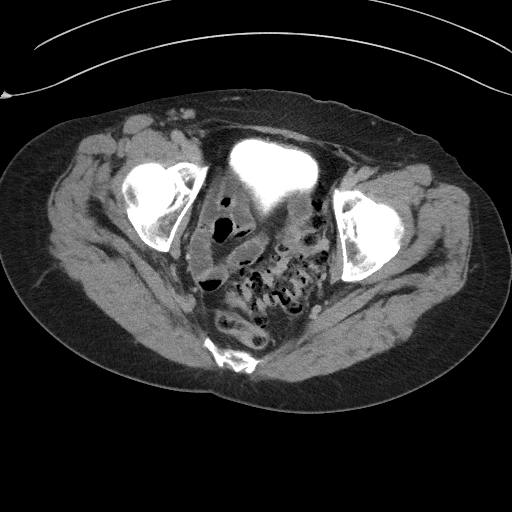
[im 46/91  soft-tissue]
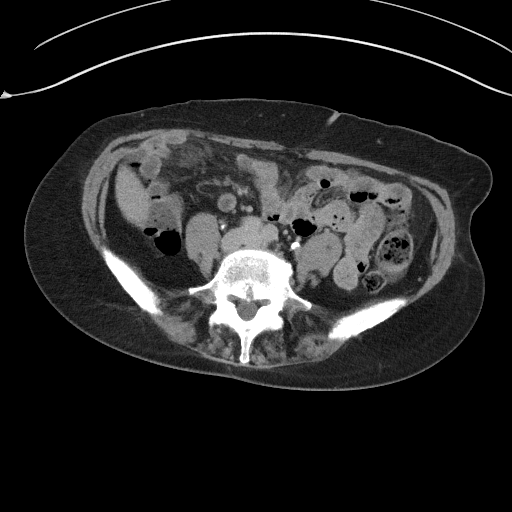

[12 of 46 positions shown; findings below may reference images not displayed]

FINDINGS: Lower chest: Subsegmental atelectasis.

Hepatobiliary: Hepatomegaly with diffuse hepatic steatosis. No
suspicious hepatic lesions. Gallbladder is surgically absent.
Prominence of the intra and extra hepatic biliary tree favor
reservoir effect post cholecystectomy.

Pancreas: Unremarkable. No pancreatic ductal dilatation or
surrounding inflammatory changes.

Spleen: Normal in size without focal abnormality.

Adrenals/Urinary Tract: Adrenal glands are unremarkable.

Similar prominence of bilateral extrarenal pelves. No renal,
ureteral or bladder calculi visualized. Left lower pole renal cyst.
No solid enhancing renal masses.

Symmetric enhancement and excretion of contrast in the bilateral
kidneys. No suspicious filling defect visualized within the
opacified portions of the collecting system or ureters on delayed
imaging. However, the mid/distal right and distal left ureters are
not well opacified limiting evaluation.

Symmetric wall thickening of the urinary bladder without suspicious
intraluminal filling defect.

Stomach/Bowel: Stomach is within normal limits. Appendix appears
normal. Left-sided colonic diverticulosis without findings of acute
diverticulitis. no evidence of bowel wall thickening, distention, or
inflammatory changes.

Vascular/Lymphatic: Aortic atherosclerosis. No enlarged abdominal or
pelvic lymph nodes.

Reproductive: Status post hysterectomy. No adnexal masses.

Other: No abdominopelvic ascites.

Musculoskeletal: Levoconvex curvature of the thoracolumbar spine.
Multilevel degenerative changes spine. Degenerative change of the
hips and SI joints. No acute osseous abnormality.
IMPRESSION: 1. Symmetric wall thickening of the urinary bladder without
suspicious intraluminal filling defect. Correlate with urinalysis to
exclude cystitis.
2. No renal, ureteral, or bladder calculi visualized. No solid
enhancing renal masses.
3. Hepatomegaly with diffuse hepatic steatosis.
4. Left-sided colonic diverticulosis without findings of acute
diverticulitis.
5.  Aortic Atherosclerosis (AWTD8-VL5.5).

## 2023-07-27 ENCOUNTER — Ambulatory Visit (HOSPITAL_COMMUNITY)
Admission: RE | Admit: 2023-07-27 | Discharge: 2023-07-27 | Disposition: A | Discharge status: Home / Self Care | Payer: Federal, State, Local not specified - PPO | Attending: Ophthalmology | Admitting: Ophthalmology

## 2023-07-27 ENCOUNTER — Encounter (HOSPITAL_COMMUNITY)
Admission: RE | Disposition: A | Discharge status: Home / Self Care | Payer: Self-pay | Source: Home / Self Care | Attending: Ophthalmology

## 2023-07-27 ENCOUNTER — Ambulatory Visit (HOSPITAL_COMMUNITY): Payer: Federal, State, Local not specified - PPO | Admitting: Anesthesiology

## 2023-07-27 ENCOUNTER — Other Ambulatory Visit: Payer: Self-pay

## 2023-07-27 ENCOUNTER — Encounter (HOSPITAL_COMMUNITY): Payer: Self-pay | Admitting: Ophthalmology

## 2023-07-27 DIAGNOSIS — Z961 Presence of intraocular lens: Secondary | ICD-10-CM | POA: Diagnosis not present

## 2023-07-27 DIAGNOSIS — Z96653 Presence of artificial knee joint, bilateral: Secondary | ICD-10-CM | POA: Diagnosis not present

## 2023-07-27 DIAGNOSIS — H2511 Age-related nuclear cataract, right eye: Secondary | ICD-10-CM | POA: Diagnosis not present

## 2023-07-27 DIAGNOSIS — Z9842 Cataract extraction status, left eye: Secondary | ICD-10-CM | POA: Diagnosis not present

## 2023-07-27 DIAGNOSIS — H25811 Combined forms of age-related cataract, right eye: Secondary | ICD-10-CM | POA: Insufficient documentation

## 2023-07-27 HISTORY — PX: CATARACT EXTRACTION W/PHACO: SHX586

## 2023-07-27 SURGERY — PHACOEMULSIFICATION, CATARACT, WITH IOL INSERTION
Anesthesia: Monitor Anesthesia Care | Site: Eye | Laterality: Right

## 2023-07-27 MED ORDER — STERILE WATER FOR IRRIGATION IR SOLN
Status: DC | PRN
  Administered 2023-07-27: 250 mL

## 2023-07-27 MED ORDER — LIDOCAINE HCL (PF) 1 % IJ SOLN
INTRAOCULAR | Status: DC | PRN
  Administered 2023-07-27: 1 mL via OPHTHALMIC

## 2023-07-27 MED ORDER — MOXIFLOXACIN HCL 5 MG/ML IO SOLN
INTRAOCULAR | Status: DC | PRN
  Administered 2023-07-27: .2 mL via INTRACAMERAL

## 2023-07-27 MED ORDER — LACTATED RINGERS IV SOLN: INTRAVENOUS | Status: DC

## 2023-07-27 MED ORDER — PHENYLEPHRINE HCL 2.5 % OP SOLN
1.0000 [drp] | OPHTHALMIC | Status: DC | PRN
  Administered 2023-07-27 (×2): 1 [drp] via OPHTHALMIC

## 2023-07-27 MED ORDER — SODIUM HYALURONATE 23MG/ML IO SOSY
PREFILLED_SYRINGE | INTRAOCULAR | Status: DC | PRN
  Administered 2023-07-27: .6 mL via INTRAOCULAR

## 2023-07-27 MED ORDER — MIDAZOLAM HCL 2 MG/2ML IJ SOLN
INTRAMUSCULAR | Status: DC | PRN
  Administered 2023-07-27: 2 mg via INTRAVENOUS

## 2023-07-27 MED ORDER — SODIUM CHLORIDE 0.9% FLUSH
INTRAVENOUS | Status: DC | PRN
  Administered 2023-07-27: 3 mL via INTRAVENOUS

## 2023-07-27 MED ORDER — LIDOCAINE HCL 3.5 % OP GEL: 1.0000 | Freq: Once | OPHTHALMIC | Status: DC

## 2023-07-27 MED ORDER — POVIDONE-IODINE 5 % OP SOLN
OPHTHALMIC | Status: DC | PRN
  Administered 2023-07-27: 1 via OPHTHALMIC

## 2023-07-27 MED ORDER — MOXIFLOXACIN HCL 5 MG/ML IO SOLN
INTRAOCULAR | Status: AC
  Filled 2023-07-27: qty 1

## 2023-07-27 MED ORDER — TROPICAMIDE 1 % OP SOLN
1.0000 [drp] | OPHTHALMIC | Status: DC | PRN
  Administered 2023-07-27 (×2): 1 [drp] via OPHTHALMIC

## 2023-07-27 MED ORDER — BSS IO SOLN
INTRAOCULAR | Status: DC | PRN
  Administered 2023-07-27: 15 mL via INTRAOCULAR

## 2023-07-27 MED ORDER — EPINEPHRINE PF 1 MG/ML IJ SOLN
INTRAMUSCULAR | Status: AC
  Filled 2023-07-27: qty 1

## 2023-07-27 MED ORDER — MIDAZOLAM HCL 2 MG/2ML IJ SOLN
INTRAMUSCULAR | Status: AC
  Filled 2023-07-27: qty 2

## 2023-07-27 MED ORDER — TETRACAINE HCL 0.5 % OP SOLN
1.0000 [drp] | OPHTHALMIC | Status: DC | PRN
  Administered 2023-07-27 (×2): 1 [drp] via OPHTHALMIC

## 2023-07-27 MED ORDER — SODIUM HYALURONATE 10 MG/ML IO SOLUTION
PREFILLED_SYRINGE | INTRAOCULAR | Status: DC | PRN
  Administered 2023-07-27: .85 mL via INTRAOCULAR

## 2023-07-27 MED ORDER — EPINEPHRINE PF 1 MG/ML IJ SOLN
INTRAOCULAR | Status: DC | PRN
  Administered 2023-07-27: 500 mL

## 2023-07-27 SURGICAL SUPPLY — 14 items
CATARACT SUITE SIGHTPATH (MISCELLANEOUS) ×1
CLOTH BEACON ORANGE TIMEOUT ST (SAFETY) ×1 IMPLANT
EYE SHIELD UNIVERSAL CLEAR (GAUZE/BANDAGES/DRESSINGS) IMPLANT
FEE CATARACT SUITE SIGHTPATH (MISCELLANEOUS) ×1 IMPLANT
GLOVE BIOGEL PI IND STRL 7.0 (GLOVE) ×2 IMPLANT
LENS IOL TECNIS EYHANCE 18.5 (Intraocular Lens) IMPLANT
NDL HYPO 18GX1.5 BLUNT FILL (NEEDLE) ×1 IMPLANT
NEEDLE HYPO 18GX1.5 BLUNT FILL (NEEDLE) ×1
PAD ARMBOARD 7.5X6 YLW CONV (MISCELLANEOUS) ×1 IMPLANT
POSITIONER HEAD 8X9X4 ADT (SOFTGOODS) ×1 IMPLANT
RING MALYGIN 7.0 (MISCELLANEOUS) IMPLANT
SYR TB 1ML LL NO SAFETY (SYRINGE) ×1 IMPLANT
TAPE SURG TRANSPORE 1 IN (GAUZE/BANDAGES/DRESSINGS) IMPLANT
WATER STERILE IRR 250ML POUR (IV SOLUTION) ×1 IMPLANT

## 2023-07-27 NOTE — Op Note (Signed)
Date of procedure: 07/27/23  Pre-operative diagnosis:  Visually significant combined form age-related cataract, Right Eye (H25.811)  Post-operative diagnosis:  Visually significant combined form age-related cataract, Right Eye (H25.811)  Procedure: Removal of cataract via phacoemulsification and insertion of intra-ocular lens Laural Benes and Johnson DIB00 +18.5D into the capsular bag of the Right Eye  Attending surgeon: Rudy Jew. Baylei Siebels, MD, MA  Anesthesia: MAC, Topical Akten  Complications: None  Estimated Blood Loss: <43mL (minimal)  Specimens: None  Implants: As above  Indications:  Visually significant age-related cataract, Right Eye  Procedure:  The patient was seen and identified in the pre-operative area. The operative eye was identified and dilated.  The operative eye was marked.  Topical anesthesia was administered to the operative eye.     The patient was then to the operative suite and placed in the supine position.  A timeout was performed confirming the patient, procedure to be performed, and all other relevant information.   The patient's face was prepped and draped in the usual fashion for intra-ocular surgery.  A lid speculum was placed into the operative eye and the surgical microscope moved into place and focused.  A superotemporal paracentesis was created using a 20 gauge paracentesis blade.  Shugarcaine was injected into the anterior chamber.  Viscoelastic was injected into the anterior chamber.  A temporal clear-corneal main wound incision was created using a 2.7mm microkeratome.  A continuous curvilinear capsulorrhexis was initiated using an irrigating cystitome and completed using capsulorrhexis forceps.  Hydrodissection and hydrodeliniation were performed.  Viscoelastic was injected into the anterior chamber.  A phacoemulsification handpiece and a chopper as a second instrument were used to remove the nucleus and epinucleus. The irrigation/aspiration handpiece was used to  remove any remaining cortical material.   The capsular bag was reinflated with viscoelastic, checked, and found to be intact.  The intraocular lens was inserted into the capsular bag.  The irrigation/aspiration handpiece was used to remove any remaining viscoelastic.  The clear corneal wound and paracentesis wounds were then hydrated and checked with Weck-Cels to be watertight. 0.39mL of Moxfloxacin was injected into the anterior chamber. The lid-speculum was removed.  The drape was removed.  The patient's face was cleaned with a wet and dry 4x4. A clear shield was taped over the eye. The patient was taken to the post-operative care unit in good condition, having tolerated the procedure well.  Post-Op Instructions: The patient will follow up at [facility] for a same day post-operative evaluation and will receive all other orders and instructions.

## 2023-07-27 NOTE — Interval H&P Note (Signed)
History and Physical Interval Note:  07/27/2023 9:18 AM  Tara Bates  has presented today for surgery, with the diagnosis of age related nuclear cataract, right eye.  The various methods of treatment have been discussed with the patient and family. After consideration of risks, benefits and other options for treatment, the patient has consented to  Procedure(s) with comments: CATARACT EXTRACTION PHACO AND INTRAOCULAR LENS PLACEMENT (IOC) (Right) - CDE: as a surgical intervention.  The patient's history has been reviewed, patient examined, no change in status, stable for surgery.  I have reviewed the patient's chart and labs.  Questions were answered to the patient's satisfaction.     [name]

## 2023-07-27 NOTE — Discharge Instructions (Addendum)
Please discharge patient when stable, will follow up today with Dr. Carnella Fryman at the [facility] [place] office immediately following discharge.  Leave shield in place until visit.  All paperwork with discharge instructions will be given at the office.  [facility] Address:  [address]  [place], [place] [address]  

## 2023-07-27 NOTE — Anesthesia Preprocedure Evaluation (Signed)
Anesthesia Evaluation  Patient identified by MRN, date of birth, ID band Patient awake    Reviewed: Allergy & Precautions, H&P , NPO status , Patient's Chart, lab work & pertinent test results, reviewed documented beta blocker date and time   History of Anesthesia Complications (+) PONV and history of anesthetic complications  Airway Mallampati: II  TM Distance: >3 FB Neck ROM: full    Dental no notable dental hx.    Pulmonary neg pulmonary ROS   Pulmonary exam normal breath sounds clear to auscultation       Cardiovascular Exercise Tolerance: Good negative cardio ROS  Rhythm:regular Rate:Normal     Neuro/Psych negative neurological ROS  negative psych ROS   GI/Hepatic negative GI ROS, Neg liver ROS,,,  Endo/Other  negative endocrine ROS    Renal/GU negative Renal ROS  negative genitourinary   Musculoskeletal   Abdominal   Peds  Hematology negative hematology ROS (+) Blood dyscrasia, anemia   Anesthesia Other Findings   Reproductive/Obstetrics negative OB ROS                             Anesthesia Physical Anesthesia Plan  ASA: 2  Anesthesia Plan: MAC   Post-op Pain Management:    Induction:   PONV Risk Score and Plan:   Airway Management Planned:   Additional Equipment:   Intra-op Plan:   Post-operative Plan:   Informed Consent: I have reviewed the patients History and Physical, chart, labs and discussed the procedure including the risks, benefits and alternatives for the proposed anesthesia with the patient or authorized representative who has indicated his/her understanding and acceptance.     Dental Advisory Given  Plan Discussed with: CRNA  Anesthesia Plan Comments:        Anesthesia Quick Evaluation

## 2023-07-27 NOTE — Transfer of Care (Signed)
Immediate Anesthesia Transfer of Care Note  Patient: Tara Bates  Procedure(s) Performed: CATARACT EXTRACTION PHACO AND INTRAOCULAR LENS PLACEMENT (IOC) (Right: Eye)  Patient Location: Short Stay  Anesthesia Type:MAC  Level of Consciousness: awake, alert , oriented, and patient cooperative  Airway & Oxygen Therapy: Patient Spontanous Breathing  Post-op Assessment: Report given to RN, Post -op Vital signs reviewed and stable, and Patient moving all extremities X 4  Post vital signs: Reviewed and stable  Last Vitals:  Vitals Value Taken Time  BP 122/70 07/27/23 0941  Temp 36.5 C 07/27/23 0941  Pulse 60 07/27/23 0941  Resp 14 07/27/23 0941  SpO2 97 % 07/27/23 0941    Last Pain:  Vitals:   07/27/23 0941  TempSrc: Oral  PainSc: 0-No pain         Complications: No notable events documented.

## 2023-07-31 NOTE — Anesthesia Postprocedure Evaluation (Signed)
Anesthesia Post Note  Patient: ARNITRA LABRAKE  Procedure(s) Performed: CATARACT EXTRACTION PHACO AND INTRAOCULAR LENS PLACEMENT (IOC) (Right: Eye)  Patient location during evaluation: Phase II Anesthesia Type: MAC Level of consciousness: awake Pain management: pain level controlled Vital Signs Assessment: post-procedure vital signs reviewed and stable Respiratory status: spontaneous breathing and respiratory function stable Cardiovascular status: blood pressure returned to baseline and stable Postop Assessment: no headache and no apparent nausea or vomiting Anesthetic complications: no Comments: Late entry   No notable events documented.   Last Vitals:  Vitals:   07/27/23 0833 07/27/23 0941  BP: 111/65 122/70  Pulse: (!) 57 60  Resp: 14 14  Temp: 36.7 C 36.5 C  SpO2: 98% 97%    Last Pain:  Vitals:   07/27/23 0941  TempSrc: Oral  PainSc: 0-No pain                 Windell Norfolk

## 2023-08-03 ENCOUNTER — Encounter (HOSPITAL_COMMUNITY): Payer: Self-pay | Admitting: Ophthalmology

## 2023-08-05 ENCOUNTER — Ambulatory Visit (INDEPENDENT_AMBULATORY_CARE_PROVIDER_SITE_OTHER): Payer: Federal, State, Local not specified - PPO | Admitting: Obstetrics and Gynecology

## 2023-08-05 ENCOUNTER — Encounter: Payer: Self-pay | Admitting: Obstetrics and Gynecology

## 2023-08-05 VITALS — BP 118/76 | HR 67 | Ht 64.25 in | Wt 176.0 lb

## 2023-08-05 DIAGNOSIS — Z01419 Encounter for gynecological examination (general) (routine) without abnormal findings: Secondary | ICD-10-CM | POA: Diagnosis not present

## 2023-08-05 NOTE — Progress Notes (Signed)
72 y.o. y.o. female here for annual exam.  G2P2002     HPI: Abdominal bloating and gas with odor.  Followed by Dr Loreta Ave, Laurette Schimke.  S/P Hysterectomy with ovaries intact.  Postmenopause, well on no HRT.  No pelvic pain.  Rarely sexually active.  bladder mesh x2 for prolapse and SUI. Had 10lb children. Reports loss in height since last visit  No LMP recorded. Patient has had a hysterectomy.    Last mammogram: 02/16/23 Last colonoscopy: 12/15/18 repeat in 5 years DXA: 06/03/22 normal There were no vitals taken for this visit.   tus  Final [99]   PACS Intelerad Image Link   Show images for US Transvaginal Non-OB Study Result  Narrative & Impression  T/V images.  Uterus is surgically absent.  Vaginal cuff normal.  Both ovaries are identified and are small with atrophic appearance.  No pelvic mass.  No free fluid seen transabdominally or transvaginally.     No results found for: "DIAGPAP", "HPVHIGH", "ADEQPAP"  GYN HISTORY: No results found for: "DIAGPAP", "HPVHIGH", "ADEQPAP"  OB History  Gravida Para Term Preterm AB Living  2 2 2     2   SAB IAB Ectopic Multiple Live Births          2    # Outcome Date GA Lbr Len/2nd Weight Sex Type Anes PTL Lv  2 Term     M Vag-Spont  N LIV  1 Term     F Vag-Spont  N LIV    Past Medical History:  Diagnosis Date   Anemia    iron deficiency anemia    Arthritis    Colon polyps    IBS (irritable bowel syndrome)    Iron deficiency    Memory loss    PONV (postoperative nausea and vomiting)    Urinary incontinence    Urinary tract infection    hx of     Past Surgical History:  Procedure Laterality Date   BACK SURGERY     LOWER BACK   BLADDER SURGERY     x 2   CATARACT EXTRACTION W/PHACO Left 07/06/2023   Procedure: CATARACT EXTRACTION PHACO AND INTRAOCULAR LENS PLACEMENT (IOC);  Surgeon: [name], MD;  Location: [facility];  Service: Ophthalmology;  Laterality: Left;  CDE: 9.37   CATARACT EXTRACTION W/PHACO Right 07/27/2023    Procedure: CATARACT EXTRACTION PHACO AND INTRAOCULAR LENS PLACEMENT (IOC);  Surgeon: [name], MD;  Location: [facility];  Service: Ophthalmology;  Laterality: Right;  CDE: 6.03   CHOLECYSTECTOMY  10/28/2012   Procedure: LAPAROSCOPIC CHOLECYSTECTOMY;  Surgeon: Shelly Rubenstein, MD;  Location: [facility];  Service: General;  Laterality: N/A;   KNEE ARTHROSCOPY     left   LAPAROSCOPIC CHOLECYSTECTOMY     REPLACEMENT TOTAL KNEE     LEFT   TOE SURGERY  1994   left small toe   TOTAL KNEE ARTHROPLASTY Right 12/26/2019   Procedure: RIGHT TOTAL KNEE ARTHROPLASTY;  Surgeon: Ollen Gross, MD;  Location: [facility];  Service: Orthopedics;  Laterality: Right;   VAGINAL HYSTERECTOMY     with sling, still has ovaries     Current Outpatient Medications on File Prior to Visit  Medication Sig Dispense Refill   Calcium Carb-Cholecalciferol (CALCIUM 600+D) 600-800 MG-UNIT TABS Take 1 tablet by mouth daily.     cetirizine (ZYRTEC ALLERGY) 10 MG tablet Take 10 mg by mouth daily.     Cholecalciferol (VITAMIN D) 50 MCG (2000 UT) CAPS Take 2,000 Units by mouth daily.  Fish Oil-Cholecalciferol (FISH OIL + D3 PO) Take 2 capsules by mouth daily.      fluticasone (FLONASE) 50 MCG/ACT nasal spray Place 3 sprays into both nostrils daily. 1 spray at morning and 2 sprays at night     methylcellulose oral powder Take 1 packet by mouth daily.     Multiple Vitamin (MULTIVITAMIN WITH MINERALS) TABS Take 1 tablet by mouth daily.     Probiotic Product (PROBIOTIC PO) Take by mouth. floragen     No current facility-administered medications on file prior to visit.    Social History   Socioeconomic History   Marital status: Married    Spouse name: Not on file   Number of children: Not on file   Years of education: Not on file   Highest education level: Not on file  Occupational History   Not on file  Tobacco Use   Smoking status: Never   Smokeless tobacco: Never  Vaping Use   Vaping status: Never Used  Substance  and Sexual Activity   Alcohol use: No    Alcohol/week: 0.0 standard drinks of alcohol   Drug use: No   Sexual activity: Not Currently    Partners: Male    Birth control/protection: Surgical    Comment: hysterectomy  Other Topics Concern   Not on file  Social History Narrative   Not on file   Social Determinants of Health   Financial Resource Strain: Not on file  Food Insecurity: Not on file  Transportation Needs: Not on file  Physical Activity: Not on file  Stress: Not on file  Social Connections: Not on file  Intimate Partner Violence: Not on file    Family History  Problem Relation Age of Onset   Thyroid disease Mother    Cancer Mother        LUNG   Cancer Father        MESOTHELIOMA   Cancer Sister        MELANOMA     Allergies  Allergen Reactions   Bactrim [Sulfamethoxazole-Trimethoprim] Nausea And Vomiting    Bactrim DS   Codeine Nausea And Vomiting   Macrobid [Nitrofurantoin Macrocrystal] Other (See Comments)    Severe headache   Morphine And Codeine Nausea And Vomiting      Patient's last menstrual period was No LMP recorded. Patient has had a hysterectomy..            Review of Systems Alls systems reviewed and are negative.     Physical Exam Constitutional:      Appearance: Normal appearance.  Genitourinary:     Vulva normal.     No lesions in the vagina.     Right Labia: No rash, lesions or skin changes.    Left Labia: No lesions, skin changes or rash.    Vaginal cuff intact.    No vaginal discharge or tenderness.     Anterior vaginal prolapse present.    Moderate vaginal atrophy present.     Right Adnexa: not tender, no mass present and not absent.    Left Adnexa: not tender and no mass present.    Cervix is absent.     Uterus is absent.  Breasts:    Right: Normal.     Left: Normal.  HENT:     Head: Normocephalic.  Neck:     Thyroid: No thyroid mass, thyromegaly or thyroid tenderness.  Cardiovascular:     Rate and Rhythm:  Normal rate and regular rhythm.     Heart  sounds: Normal heart sounds, S1 normal and S2 normal.  Pulmonary:     Effort: Pulmonary effort is normal.     Breath sounds: Normal breath sounds and air entry.  Abdominal:     General: There is no distension.     Palpations: Abdomen is soft. There is no mass.     Tenderness: There is no abdominal tenderness. There is no guarding or rebound.  Musculoskeletal:        General: Normal range of motion.     Cervical back: Full passive range of motion without pain, normal range of motion and neck supple. No tenderness.     Right lower leg: No edema.     Left lower leg: No edema.  Neurological:     Mental Status: She is alert.  Skin:    General: Skin is warm.  Psychiatric:        Mood and Affect: Mood normal.        Behavior: Behavior normal.        Thought Content: Thought content normal.  Vitals and nursing note reviewed. Exam conducted with a chaperone present.       A:         Well Woman GYN exam                             P:        Pap smear not indicated Encouraged annual mammogram screening Colon cancer screening up-to-date due in 2025 DXA up-to-date due in 2025 Labs and immunizations to do with PMD Encouraged healthy lifestyle practices Encouraged Vit D and Calcium   No follow-ups on file.  Earley Favor

## 2023-08-06 IMAGING — US US THYROID
1 series · 13 of 25 positions shown · non-contrast
Comparison: None.

CLINICAL DATA: Thyromegaly

EXAM:
THYROID ULTRASOUND
TECHNIQUE: Ultrasound examination of the thyroid gland and adjacent soft
tissues was performed.

[Series 1: us thyroid · 0.04mm/px · 13 of 48 slices shown]
[im 1/48]
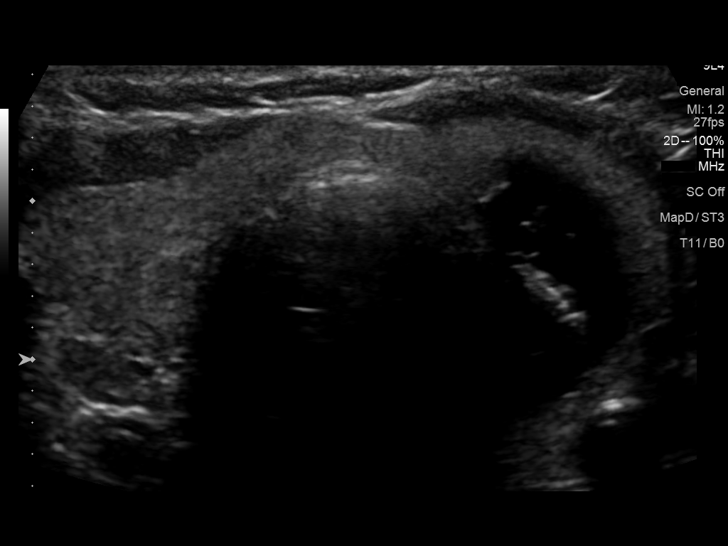
[im 4/48]
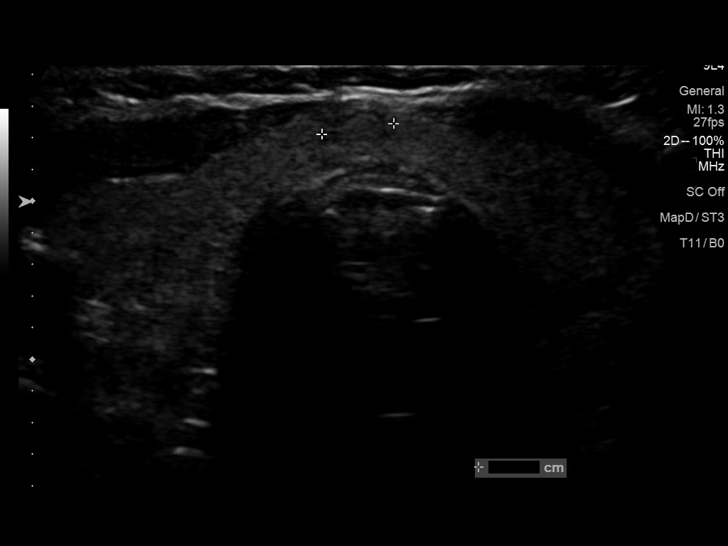
[im 8/48]
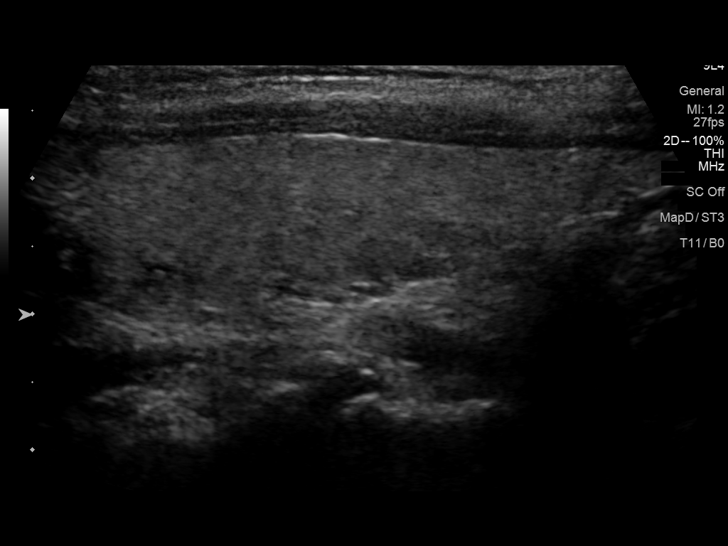
[im 12/48]
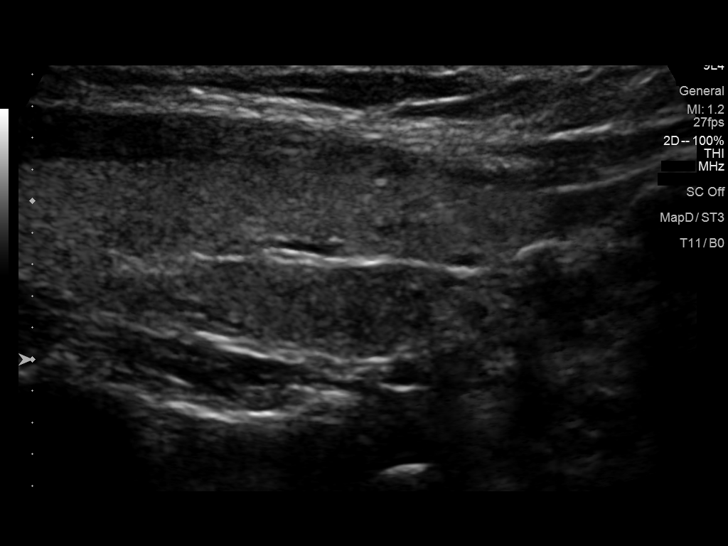
[im 16/48]
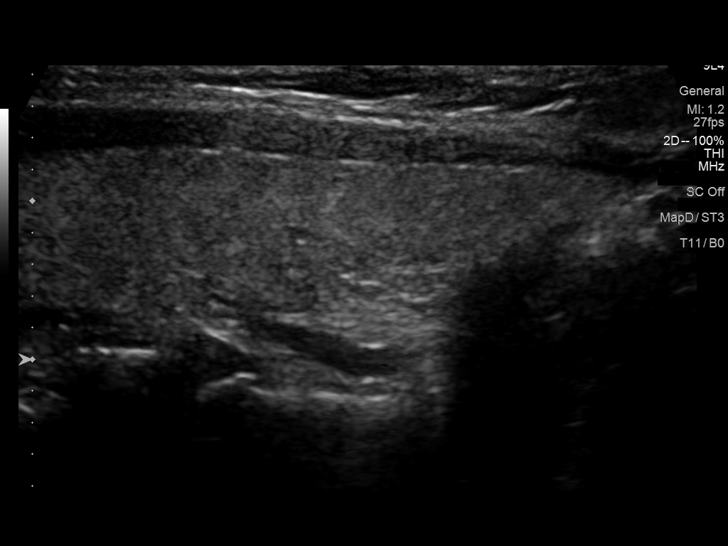
[im 20/48]
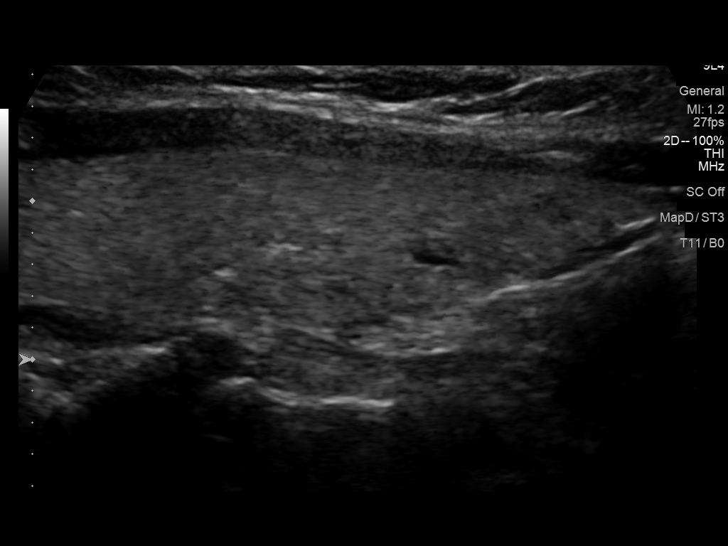
[im 24/48]
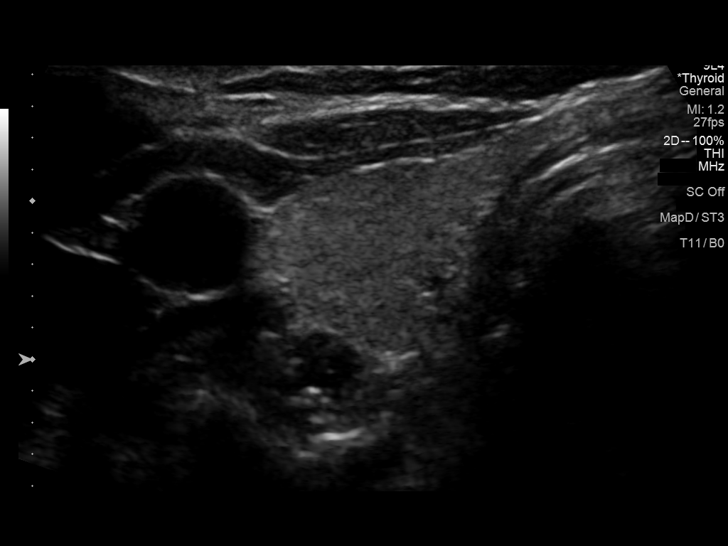
[im 28/48]
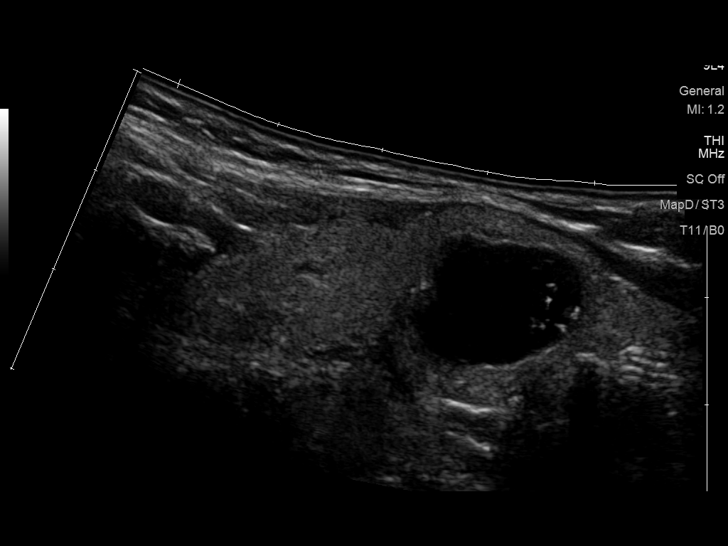
[im 32/48]
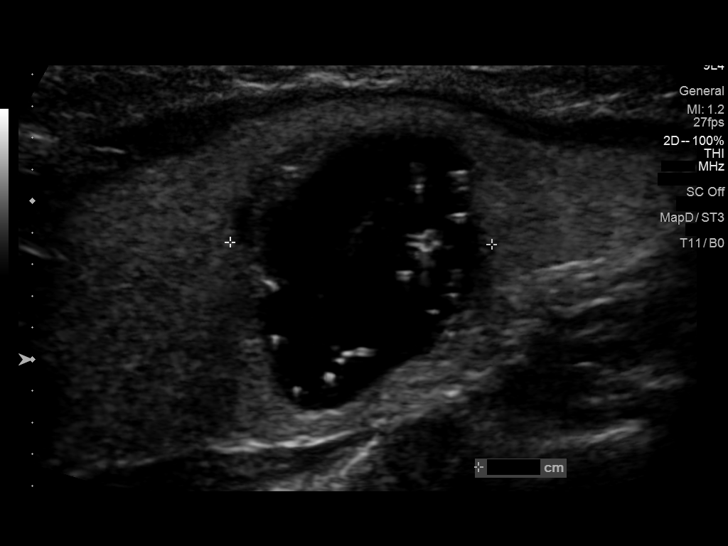
[im 36/48]
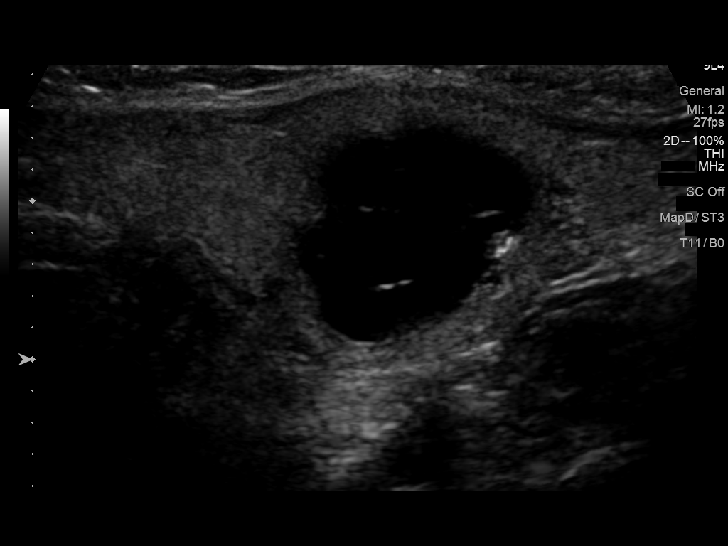
[im 40/48]
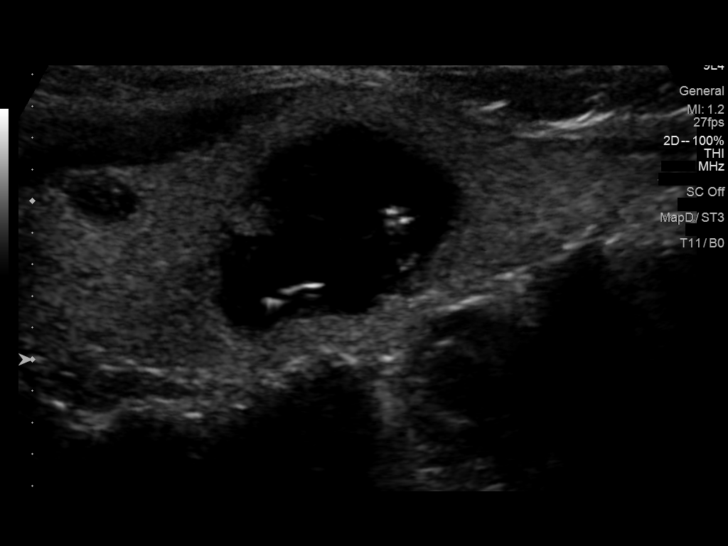
[im 44/48]
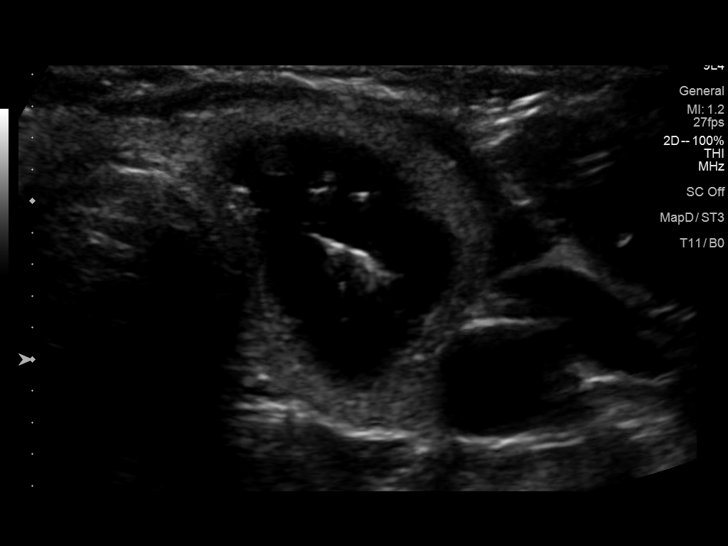
[im 48/48]
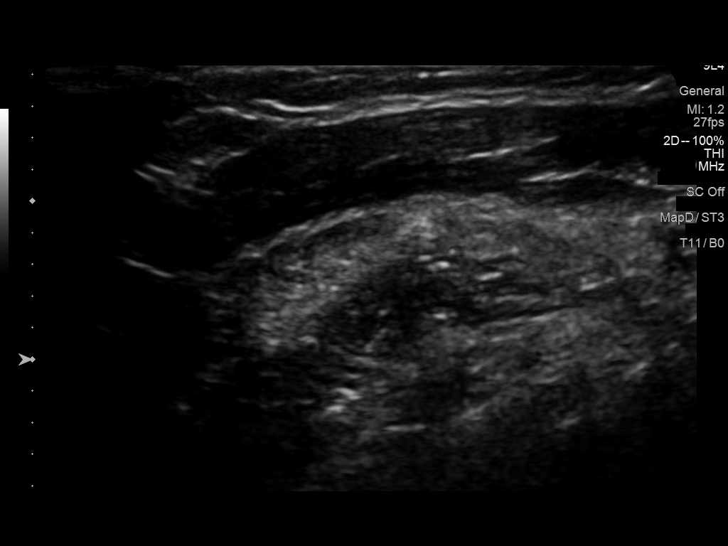

[13 of 25 positions shown; findings below may reference images not displayed]

FINDINGS: Parenchymal Echotexture: Mildly heterogenous

Isthmus: 3 mm

Right lobe: 4.8 x 1.2 x 1.4 cm

Left lobe: 4.7 x 1.8 x 1.5 cm

_________________________________________________________

Estimated total number of nodules >/= 1 cm: 1

Number of spongiform nodules >/=  2 cm not described below (TR1): 0

Number of mixed cystic and solid nodules >/= 1.5 cm not described
below (TR2): 0

_________________________________________________________

Nodule # 2:

Location: Left; Mid

Maximum size: 1.7 cm; Other 2 dimensions: 1.7 x 1.6 cm

Composition: cystic/almost completely cystic (0)

Echogenicity: anechoic (0)

Shape: taller-than-wide (3)

Margins: ill-defined (0)

Echogenic foci: large comet-tail artifacts (0)

ACR TI-RADS total points: 3.

ACR TI-RADS risk category: TR3 (3 points).

ACR TI-RADS recommendations:

*Given size (>/= 1.5 - 2.4 cm) and appearance, a follow-up
ultrasound in 1 year should be considered based on TI-RADS criteria.

_________________________________________________________

Additional bilateral subcentimeter hypoechoic nodules noted, all
measuring 9 mm or less in size. These would not meet criteria for
any biopsy or follow-up and are not fully described by TI rads
criteria.

No hypervascularity.  No regional adenopathy.
IMPRESSION: 1.7 cm TR 3 cystic nodule meets criteria for follow-up in 1 year.

Additional subcentimeter nodules noted.

The above is in keeping with the ACR TI-RADS recommendations - [HOSPITAL] 7944;[DATE].

## 2023-11-13 ENCOUNTER — Other Ambulatory Visit (HOSPITAL_COMMUNITY): Payer: Self-pay | Admitting: Internal Medicine

## 2023-11-13 DIAGNOSIS — R059 Cough, unspecified: Secondary | ICD-10-CM

## 2023-11-20 ENCOUNTER — Ambulatory Visit (HOSPITAL_COMMUNITY)
Admission: RE | Admit: 2023-11-20 | Discharge: 2023-11-20 | Disposition: A | Discharge status: Home / Self Care | Payer: Federal, State, Local not specified - PPO | Source: Ambulatory Visit | Attending: Internal Medicine | Admitting: Internal Medicine

## 2023-11-20 DIAGNOSIS — R059 Cough, unspecified: Secondary | ICD-10-CM | POA: Diagnosis present

## 2023-11-20 MED ORDER — IOHEXOL 300 MG/ML  SOLN
75.0000 mL | Freq: Once | INTRAMUSCULAR | Status: AC | PRN
  Administered 2023-11-20: 75 mL via INTRAVENOUS

## 2024-03-17 IMAGING — US US THYROID
1 series · 13 of 25 positions shown · non-contrast
Comparison: 05/23/2021

CLINICAL DATA: Prior ultrasound follow-up.

EXAM:
THYROID ULTRASOUND
TECHNIQUE: Ultrasound examination of the thyroid gland and adjacent soft
tissues was performed.

[Series 1: us thyroid · 0.04mm/px · 68 acquisitions, 13 frames shown]
[im 1/68]
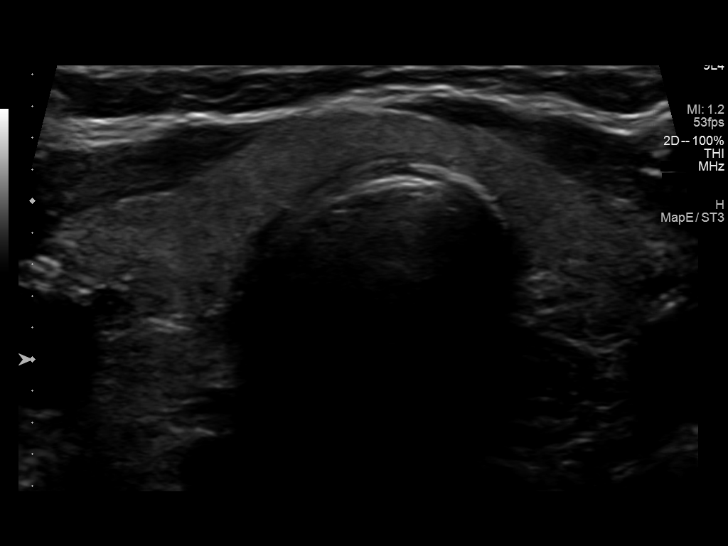
[im 6/68]
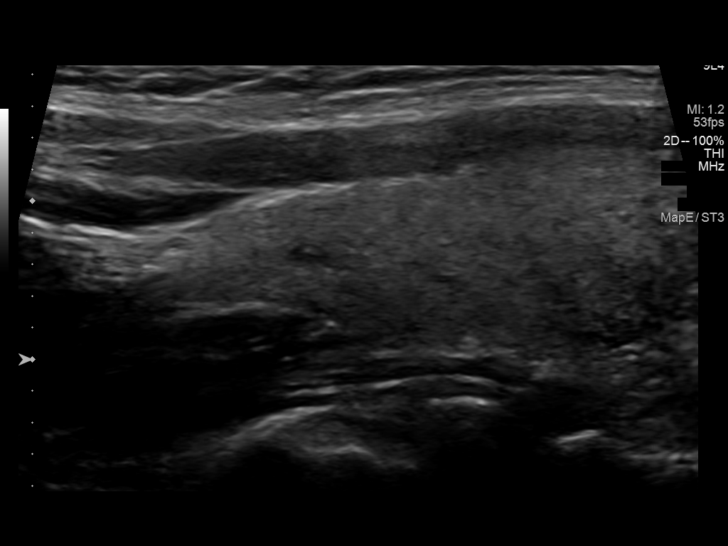
[im 12/68]
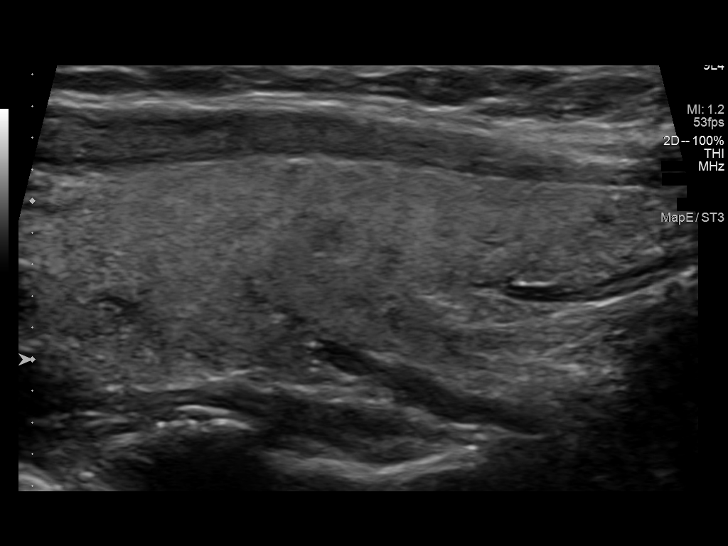
[im 17/68]
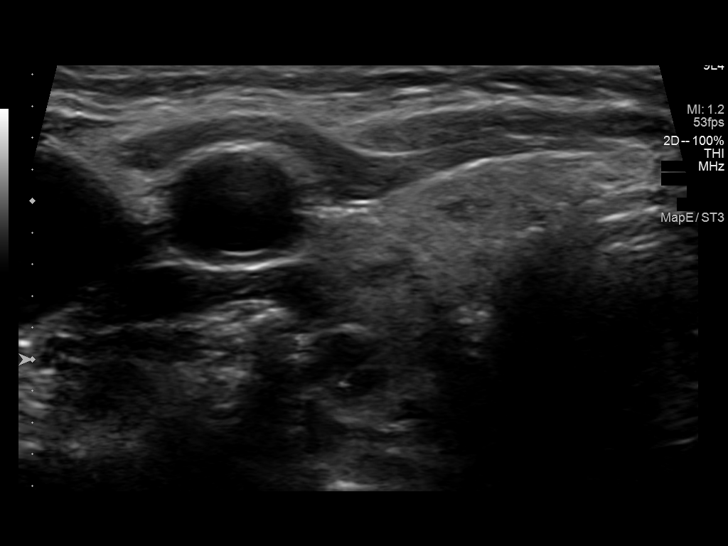
[im 23/68]
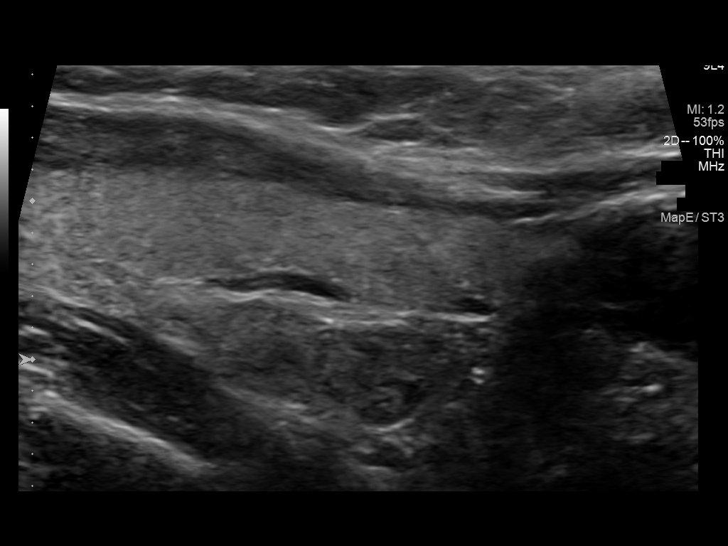
[im 28/68]
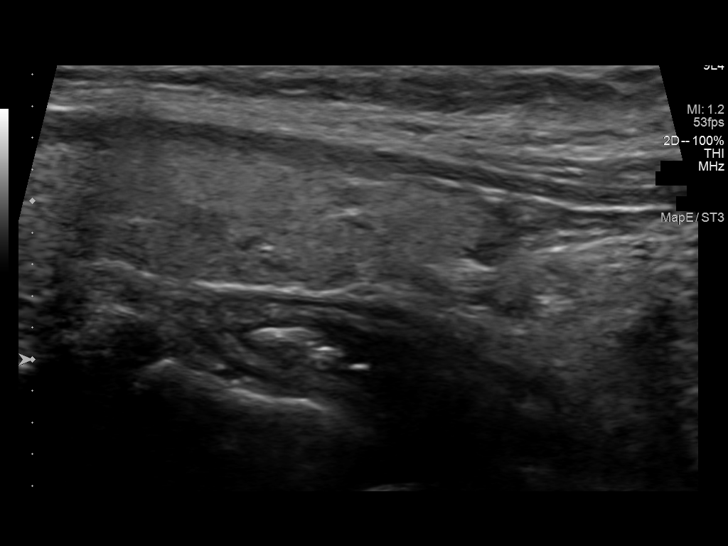
[im 34/68]
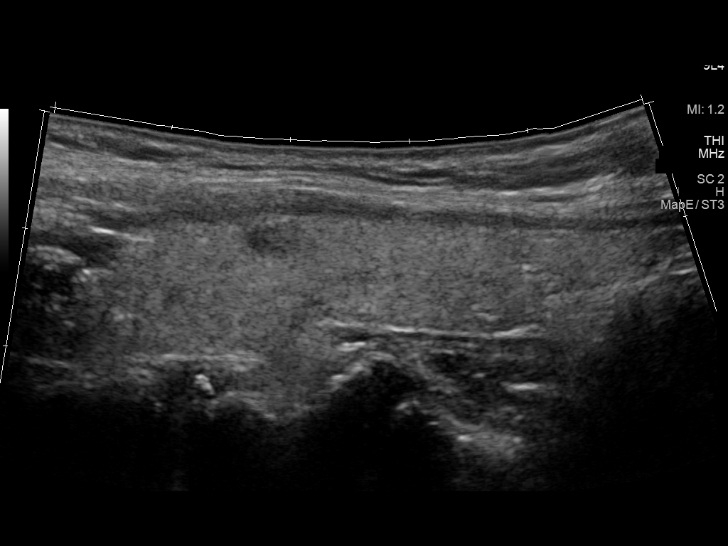
[im 40/68]
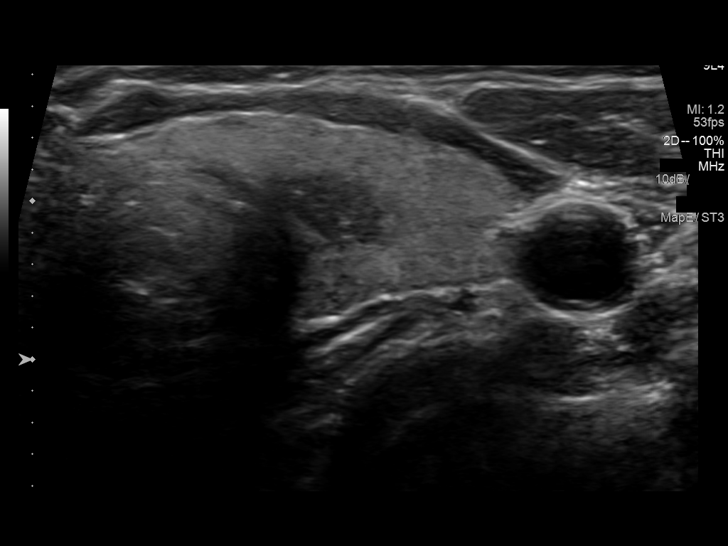
[im 45/68]
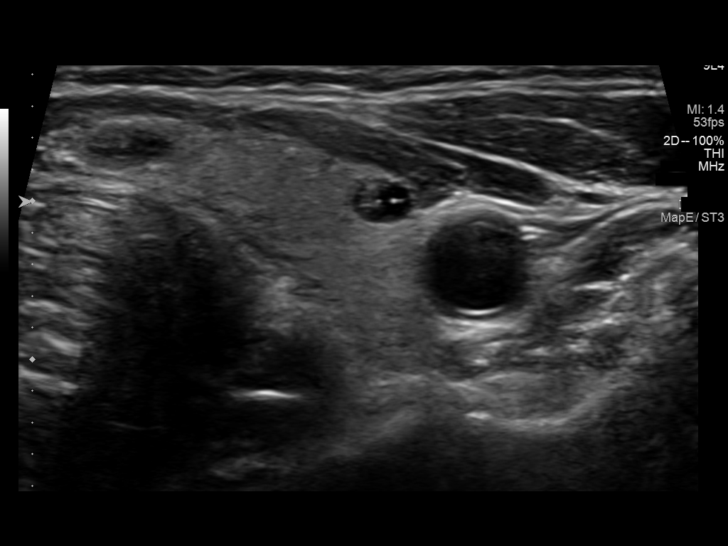
[im 51/68]
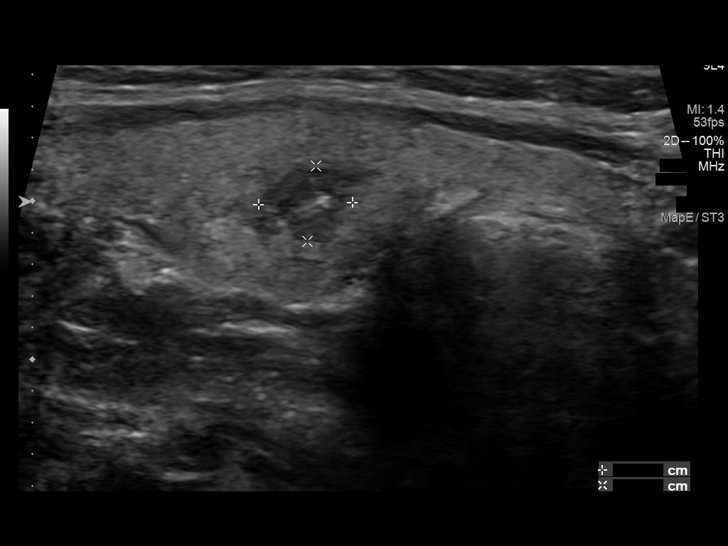
[im 56/68]
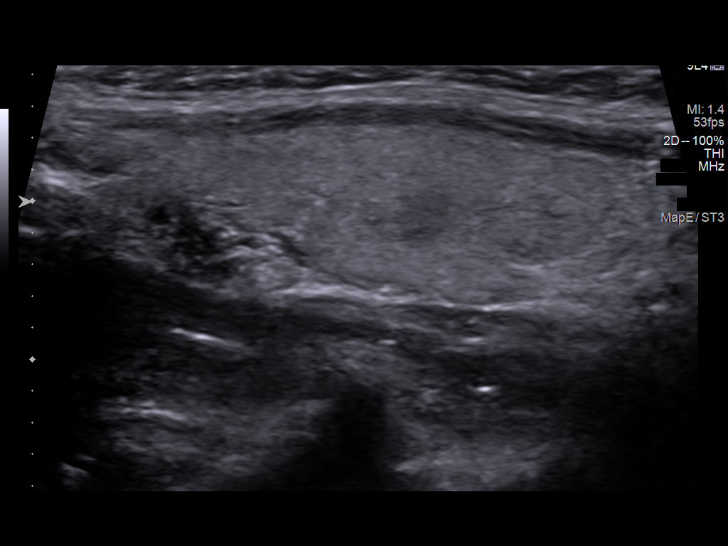
[im 62/68]
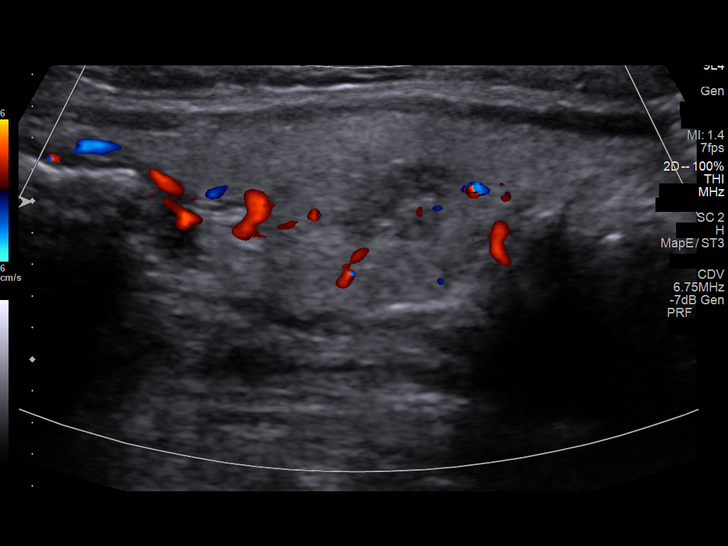
[im 68/68]
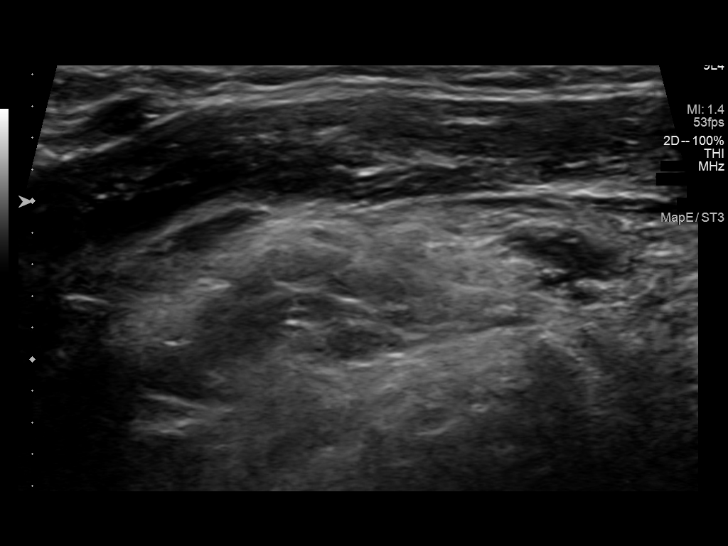

[13 of 25 positions shown; findings below may reference images not displayed]

FINDINGS: Parenchymal Echotexture: Mildly heterogenous

Isthmus: 0.3 cm, previously 0.3 cm

Right lobe: 4.5 x 1.4 x 1.3 cm, previously 4.8 x 1.2 x 1.4 cm

Left lobe: 4.6 x 1.2 x 1.7 cm, previously 4.7 x 1.8 x 1.5 cm

_________________________________________________________

Estimated total number of nodules >/= 1 cm: 0

Number of spongiform nodules >/=  2 cm not described below (TR1): 0

Number of mixed cystic and solid nodules >/= 1.5 cm not described
below (TR2): 0

_________________________________________________________

Similar appearing hypo echo ache nodule in the posterior right
inferior thyroid measuring up to 0.8 cm is unchanged and likely
represents right inferior parathyroid gland.

Nodule # 3:

Prior biopsy: No

Location: Left; Mid

Maximum size: 0.7 cm; Other 2 dimensions: 0.6 x 0.5 cm, previously,
1.7 x 1.7 x 1.6 cm

Composition: mixed cystic and solid (1)

Echogenicity: hypoechoic (2)

Shape: not taller-than-wide (0)

Margins: ill-defined (0)

Echogenic foci: none (0)

ACR TI-RADS total points: 3.

ACR TI-RADS risk category:  TR3 (3 points).

Significant change in size (>/= 20% in two dimensions and minimal
increase of 2 mm): No

Change in features: Yes

Change in ACR TI-RADS risk category: Yes

ACR TI-RADS recommendations:

Given size (<1.4 cm) and appearance, this nodule does NOT meet
TI-RADS criteria for biopsy or dedicated follow-up.

_________________________________________________________

No new discrete nodules.  No cervical lymphadenopathy.
IMPRESSION: Significant interval decreased size of previously visualized cystic
left mid thyroid nodule (labeled 3, 0.7 cm, previously 1.7 cm). Due
to decreased size, this nodule no longer meets criteria (TI-RADS
category 3) for dedicated ultrasound follow-up or tissue sampling.

The above is in keeping with the ACR TI-RADS recommendations - [HOSPITAL] 3967;[DATE].

## 2024-07-08 ENCOUNTER — Other Ambulatory Visit (HOSPITAL_COMMUNITY): Payer: Self-pay | Admitting: Internal Medicine

## 2024-07-08 DIAGNOSIS — R911 Solitary pulmonary nodule: Secondary | ICD-10-CM

## 2024-07-08 DIAGNOSIS — E041 Nontoxic single thyroid nodule: Secondary | ICD-10-CM

## 2024-07-13 ENCOUNTER — Ambulatory Visit (HOSPITAL_COMMUNITY)
Admission: RE | Admit: 2024-07-13 | Discharge: 2024-07-13 | Disposition: A | Discharge status: Home / Self Care | Source: Ambulatory Visit | Attending: Internal Medicine | Admitting: Internal Medicine

## 2024-07-13 DIAGNOSIS — R911 Solitary pulmonary nodule: Secondary | ICD-10-CM | POA: Diagnosis present

## 2024-07-15 ENCOUNTER — Ambulatory Visit (HOSPITAL_COMMUNITY)
Admission: RE | Admit: 2024-07-15 | Discharge: 2024-07-15 | Disposition: A | Discharge status: Home / Self Care | Source: Ambulatory Visit | Attending: Internal Medicine | Admitting: Internal Medicine

## 2024-07-15 DIAGNOSIS — E041 Nontoxic single thyroid nodule: Secondary | ICD-10-CM | POA: Diagnosis present

## 2024-07-22 ENCOUNTER — Ambulatory Visit (HOSPITAL_COMMUNITY)
